# Patient Record
Sex: Female | Born: 1990 | Race: White | Hispanic: No | Marital: Single | Smoking: Former smoker
Health system: Southern US, Community
[De-identification: ages and names within clinical notes are randomized; demographics above are authoritative.]

## PROBLEM LIST (undated history)

## (undated) DIAGNOSIS — Z789 Other specified health status: Secondary | ICD-10-CM

## (undated) HISTORY — PX: WISDOM TOOTH EXTRACTION: SHX21

---
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
## 2013-08-23 ENCOUNTER — Other Ambulatory Visit: Payer: Self-pay | Admitting: Nurse Practitioner

## 2013-08-28 NOTE — Telephone Encounter (Signed)
Patient last seen in office on 09-02-12. Please advise

## 2013-09-23 ENCOUNTER — Other Ambulatory Visit: Payer: Self-pay | Admitting: Nurse Practitioner

## 2013-09-25 ENCOUNTER — Encounter: Payer: Self-pay | Admitting: Nurse Practitioner

## 2013-09-25 ENCOUNTER — Ambulatory Visit (INDEPENDENT_AMBULATORY_CARE_PROVIDER_SITE_OTHER): Payer: BC Managed Care – PPO | Admitting: Nurse Practitioner

## 2013-09-25 ENCOUNTER — Encounter (INDEPENDENT_AMBULATORY_CARE_PROVIDER_SITE_OTHER): Payer: Self-pay

## 2013-09-25 VITALS — BP 95/60 | HR 58 | Temp 98.2°F | Ht 64.0 in | Wt 126.0 lb

## 2013-09-25 DIAGNOSIS — R5383 Other fatigue: Secondary | ICD-10-CM

## 2013-09-25 DIAGNOSIS — Z0000 Encounter for general adult medical examination without abnormal findings: Secondary | ICD-10-CM

## 2013-09-25 DIAGNOSIS — Z01419 Encounter for gynecological examination (general) (routine) without abnormal findings: Secondary | ICD-10-CM

## 2013-09-25 DIAGNOSIS — R5381 Other malaise: Secondary | ICD-10-CM

## 2013-09-25 LAB — POCT URINALYSIS DIPSTICK
BILIRUBIN UA: NEGATIVE
GLUCOSE UA: NEGATIVE
KETONES UA: NEGATIVE
LEUKOCYTES UA: NEGATIVE
Nitrite, UA: NEGATIVE
PH UA: 5
Protein, UA: NEGATIVE
RBC UA: NEGATIVE
Spec Grav, UA: 1.015
Urobilinogen, UA: NEGATIVE

## 2013-09-25 MED ORDER — DROSPIRENONE-ETHINYL ESTRADIOL 3-0.02 MG PO TABS: ORAL_TABLET | ORAL | Status: DC

## 2013-09-25 NOTE — Progress Notes (Signed)
   Subjective:    Patient ID: Kayla Stone, female    DOB: 1991-03-23, 23 y.o.   MRN: [id number]  HPI  Patient here today for CPE and PAP- doing well - only complaint is fatigue- says she has no injury to do anything- started about 3 months ago- LMP 09/17/13    Review of Systems  Constitutional: Positive for fatigue. Negative for appetite change.  HENT: Negative.   Eyes: Negative.   Respiratory: Negative.   Cardiovascular: Negative.   Gastrointestinal: Negative.   Genitourinary: Negative.   Neurological: Negative.   Psychiatric/Behavioral: Negative.   All other systems reviewed and are negative.       Objective:   Physical Exam  Constitutional: She is oriented to person, place, and time. She appears well-developed and well-nourished.  HENT:  Head: Normocephalic.  Right Ear: Hearing, tympanic membrane, external ear and ear canal normal.  Left Ear: Hearing, tympanic membrane, external ear and ear canal normal.  Nose: Nose normal.  Mouth/Throat: Uvula is midline and oropharynx is clear and moist.  Eyes: Conjunctivae and EOM are normal. Pupils are equal, round, and reactive to light.  Neck: Normal range of motion and full passive range of motion without pain. Neck supple. No JVD present. Carotid bruit is not present. No mass and no thyromegaly present.  Cardiovascular: Normal rate, normal heart sounds and intact distal pulses.   No murmur heard. Pulmonary/Chest: Effort normal and breath sounds normal.  Abdominal: Soft. Bowel sounds are normal. She exhibits no mass. There is no tenderness.  Genitourinary: Vagina normal and uterus normal. No breast swelling, tenderness, discharge or bleeding.  bimanual exam-No adnexal masses or tenderness. Cervix nonparous and pink- no discharge  Musculoskeletal: Normal range of motion.  Lymphadenopathy:    She has no cervical adenopathy.  Neurological: She is alert and oriented to person, place, and time.  Skin: Skin is warm and dry.    Psychiatric: She has a normal mood and affect. Her behavior is normal. Judgment and thought content normal.    BP 95/60  Pulse 58  Temp(Src) 98.2 F (36.8 C) (Oral)  Ht $R'5\' 4"'rs$  (1.626 m)  Wt 126 lb (57.153 kg)  BMI 21.62 kg/m2       Assessment & Plan:   1. Annual physical exam   2. Other malaise and fatigue    Orders Placed This Encounter  Procedures  . CMP14+EGFR  . Thyroid Panel With TSH  . Anemia Profile B  . POCT urinalysis dipstick   Meds ordered this encounter  Medications  . drospirenone-ethinyl estradiol (GIANVI) 3-0.02 MG tablet    Sig: TAKE AS DIRECTED    Dispense:  28 tablet    Refill:  11    Order Specific Question:  Supervising Provider    Answer:  Chipper Herb [[id number]]     Labs pending Health maintenance reviewed Diet and exercise encouraged Continue all meds Follow up  In 1 year    Mary-Margaret Hassell Done, FNP

## 2013-09-25 NOTE — Patient Instructions (Signed)

## 2013-09-26 LAB — ANEMIA PROFILE B
BASOS: 1 %
Basophils Absolute: 0 10*3/uL (ref 0.0–0.2)
EOS ABS: 0.3 10*3/uL (ref 0.0–0.4)
EOS: 4 %
Ferritin: 25 ng/mL (ref 15–150)
Folate: 13.3 ng/mL (ref >3.0)
HCT: 42.3 % (ref 34.0–46.6)
HEMOGLOBIN: 14.4 g/dL (ref 11.1–15.9)
IMMATURE GRANS (ABS): 0 10*3/uL (ref 0.0–0.1)
IRON: 99 ug/dL (ref 35–155)
Immature Granulocytes: 0 %
Iron Saturation: 32 % (ref 15–55)
Lymphocytes Absolute: 2.3 10*3/uL (ref 0.7–3.1)
Lymphs: 35 %
MCH: 32.1 pg (ref 26.6–33.0)
MCHC: 34 g/dL (ref 31.5–35.7)
MCV: 94 fL (ref 79–97)
MONOS ABS: 0.5 10*3/uL (ref 0.1–0.9)
Monocytes: 8 %
NEUTROS ABS: 3.4 10*3/uL (ref 1.4–7.0)
Neutrophils Relative %: 52 %
Platelets: 270 10*3/uL (ref 150–379)
RBC: 4.48 x10E6/uL (ref 3.77–5.28)
RDW: 12.8 % (ref 12.3–15.4)
Retic Ct Pct: 1 % (ref 0.6–2.6)
TIBC: 308 ug/dL (ref 250–450)
UIBC: 209 ug/dL (ref 150–375)
VITAMIN B 12: 314 pg/mL (ref 211–946)
WBC: 6.6 10*3/uL (ref 3.4–10.8)

## 2013-09-26 LAB — CMP14+EGFR
A/G RATIO: 2 (ref 1.1–2.5)
ALT: 11 IU/L (ref 0–32)
AST: 20 IU/L (ref 0–40)
Albumin: 4.4 g/dL (ref 3.5–5.5)
Alkaline Phosphatase: 58 IU/L (ref 39–117)
BUN/Creatinine Ratio: 6 — ABNORMAL LOW (ref 8–20)
BUN: 4 mg/dL — AB (ref 6–20)
CO2: 26 mmol/L (ref 18–29)
CREATININE: 0.69 mg/dL (ref 0.57–1.00)
Calcium: 9.8 mg/dL (ref 8.7–10.2)
Chloride: 101 mmol/L (ref 97–108)
GFR calc Af Amer: 143 mL/min/{1.73_m2} (ref >59)
GFR, EST NON AFRICAN AMERICAN: 124 mL/min/{1.73_m2} (ref >59)
GLOBULIN, TOTAL: 2.2 g/dL (ref 1.5–4.5)
Glucose: 80 mg/dL (ref 65–99)
Potassium: 4.5 mmol/L (ref 3.5–5.2)
SODIUM: 142 mmol/L (ref 134–144)
Total Bilirubin: 0.4 mg/dL (ref 0.0–1.2)
Total Protein: 6.6 g/dL (ref 6.0–8.5)

## 2013-09-26 LAB — THYROID PANEL WITH TSH
FREE THYROXINE INDEX: 2.6 (ref 1.2–4.9)
T3 UPTAKE RATIO: 25 % (ref 24–39)
T4 TOTAL: 10.2 ug/dL (ref 4.5–12.0)
TSH: 3.45 u[IU]/mL (ref 0.450–4.500)

## 2013-09-27 LAB — PAP IG, CT-NG, RFX HPV ASCU
Chlamydia, Nuc. Acid Amp: NEGATIVE
GONOCOCCUS BY NUCLEIC ACID AMP: NEGATIVE
PAP Smear Comment: 0

## 2013-09-29 ENCOUNTER — Ambulatory Visit (INDEPENDENT_AMBULATORY_CARE_PROVIDER_SITE_OTHER): Payer: BC Managed Care – PPO | Admitting: Nurse Practitioner

## 2013-09-29 ENCOUNTER — Encounter: Payer: Self-pay | Admitting: Nurse Practitioner

## 2013-09-29 VITALS — BP 104/64 | HR 94 | Temp 98.4°F | Ht 64.0 in | Wt 127.0 lb

## 2013-09-29 DIAGNOSIS — J0100 Acute maxillary sinusitis, unspecified: Secondary | ICD-10-CM

## 2013-09-29 MED ORDER — AZITHROMYCIN 250 MG PO TABS: ORAL_TABLET | ORAL | Status: DC

## 2013-09-29 NOTE — Progress Notes (Signed)
   Subjective:    Patient ID: Kayla Stone, female    DOB: 03/16/1991, 23 y.o.   MRN: [id number]  HPI  Patient here today with c/o cough and congestion and headache- started 2 days ago.    Review of Systems  Constitutional: Positive for fever (low grade fever). Negative for chills.  HENT: Positive for congestion, rhinorrhea and sinus pressure. Negative for sore throat and trouble swallowing.   Respiratory: Positive for cough (productive).   Cardiovascular: Negative.   Gastrointestinal: Negative.   All other systems reviewed and are negative.       Objective:   Physical Exam  Constitutional: She is oriented to person, place, and time. She appears well-developed and well-nourished.  HENT:  Right Ear: Hearing, tympanic membrane, external ear and ear canal normal.  Left Ear: Hearing, tympanic membrane, external ear and ear canal normal.  Nose: Mucosal edema and rhinorrhea present. Right sinus exhibits maxillary sinus tenderness. Right sinus exhibits no frontal sinus tenderness. Left sinus exhibits maxillary sinus tenderness. Left sinus exhibits no frontal sinus tenderness.  Mouth/Throat: Uvula is midline, oropharynx is clear and moist and mucous membranes are normal.  Eyes: Pupils are equal, round, and reactive to light.  Neck: Normal range of motion. Neck supple.  Cardiovascular: Normal rate, regular rhythm and normal heart sounds.   Pulmonary/Chest: Effort normal and breath sounds normal.  Abdominal: Soft. Bowel sounds are normal.  Lymphadenopathy:    She has no cervical adenopathy.  Neurological: She is alert and oriented to person, place, and time.  Skin: Skin is warm.  Psychiatric: She has a normal mood and affect. Her behavior is normal. Judgment and thought content normal.    BP 104/64  Pulse 94  Temp(Src) 98.4 F (36.9 C) (Oral)  Ht 5\' 4"  (1.626 m)  Wt 127 lb (57.607 kg)  BMI 21.79 kg/m2       Assessment & Plan:   1. Sinusitis, acute, maxillary    Meds  ordered this encounter  Medications  . azithromycin (ZITHROMAX Z-PAK) 250 MG tablet    Sig: As directed    Dispense:  6 each    Refill:  0    Order Specific Question:  Supervising Provider    Answer:  Ernestina PennaMOORE, DONALD W [[id number]]   1. Take meds as prescribed 2. Use a cool mist humidifier especially during the winter months and when heat has been humid. 3. Use saline nose sprays frequently 4. Saline irrigations of the nose can be very helpful if done frequently.  * 4X daily for 1 week*  * Use of a nettie pot can be helpful with this. Follow directions with this* 5. Drink plenty of fluids 6. Keep thermostat turn down low 7.For any cough or congestion  Use plain Mucinex- regular strength or max strength is fine   * Children- consult with Pharmacist for dosing 8. For fever or aces or pains- take tylenol or ibuprofen appropriate for age and weight.  * for fevers greater than 101 orally you may alternate ibuprofen and tylenol every  3 hours.   Mary-Margaret Daphine DeutscherMartin, FNP

## 2013-09-29 NOTE — Patient Instructions (Signed)

## 2013-10-12 ENCOUNTER — Ambulatory Visit: Payer: BC Managed Care – PPO | Admitting: Nurse Practitioner

## 2013-10-13 ENCOUNTER — Encounter: Payer: Self-pay | Admitting: General Practice

## 2013-10-13 ENCOUNTER — Ambulatory Visit: Payer: BC Managed Care – PPO | Admitting: General Practice

## 2013-10-13 ENCOUNTER — Ambulatory Visit (INDEPENDENT_AMBULATORY_CARE_PROVIDER_SITE_OTHER): Payer: BC Managed Care – PPO | Admitting: General Practice

## 2013-10-13 VITALS — BP 109/69 | HR 68 | Temp 97.7°F | Ht 64.0 in | Wt 123.0 lb

## 2013-10-13 DIAGNOSIS — L0231 Cutaneous abscess of buttock: Secondary | ICD-10-CM

## 2013-10-13 DIAGNOSIS — L03317 Cellulitis of buttock: Secondary | ICD-10-CM

## 2013-10-13 MED ORDER — CEPHALEXIN 500 MG PO CAPS: 500.0000 mg | ORAL_CAPSULE | Freq: Two times a day (BID) | ORAL | Status: DC

## 2013-10-13 NOTE — Patient Instructions (Signed)
Abscess An abscess is an infected area that contains a collection of pus and debris.It can occur in almost any part of the body. An abscess is also known as a furuncle or boil. CAUSES  An abscess occurs when tissue gets infected. This can occur from blockage of oil or sweat glands, infection of hair follicles, or a minor injury to the skin. As the body tries to fight the infection, pus collects in the area and creates pressure under the skin. This pressure causes pain. People with weakened immune systems have difficulty fighting infections and get certain abscesses more often.  SYMPTOMS Usually an abscess develops on the skin and becomes a painful mass that is red, warm, and tender. If the abscess forms under the skin, you may feel a moveable soft area under the skin. Some abscesses break open (rupture) on their own, but most will continue to get worse without care. The infection can spread deeper into the body and eventually into the bloodstream, causing you to feel ill.  DIAGNOSIS  Your caregiver will take your medical history and perform a physical exam. A sample of fluid may also be taken from the abscess to determine what is causing your infection. TREATMENT  Your caregiver may prescribe antibiotic medicines to fight the infection. However, taking antibiotics alone usually does not cure an abscess. Your caregiver may need to make a small cut (incision) in the abscess to drain the pus. In some cases, gauze is packed into the abscess to reduce pain and to continue draining the area. HOME CARE INSTRUCTIONS   Only take over-the-counter or prescription medicines for pain, discomfort, or fever as directed by your caregiver.  If you were prescribed antibiotics, take them as directed. Finish them even if you start to feel better.  If gauze is used, follow your caregiver's directions for changing the gauze.  To avoid spreading the infection:  Keep your draining abscess covered with a  bandage.  Wash your hands well.  Do not share personal care items, towels, or whirlpools with others.  Avoid skin contact with others.  Keep your skin and clothes clean around the abscess.  Keep all follow-up appointments as directed by your caregiver. SEEK MEDICAL CARE IF:   You have increased pain, swelling, redness, fluid drainage, or bleeding.  You have muscle aches, chills, or a general ill feeling.  You have a fever. MAKE SURE YOU:   Understand these instructions.  Will watch your condition.  Will get help right away if you are not doing well or get worse. Document Released: 05/06/2005 Document Revised: 01/26/2012 Document Reviewed: 10/09/2011 ExitCare Patient Information 2014 [employer].  

## 2013-10-13 NOTE — Progress Notes (Signed)
   Subjective:    Patient ID: Kayla Stone, female    DOB: 06/09/1991, 23 y.o.   MRN: [id number]  HPI Patient presents today with complaints of tender area to right buttocks. Onset was Tuesday and gradually worsened in discomfort. Denies drainage.     Review of Systems  Constitutional: Negative for fever and chills.  Respiratory: Negative for chest tightness and shortness of breath.   Cardiovascular: Negative for chest pain and palpitations.  Skin:       Red tender area to right buttocks       Objective:   Physical Exam  Constitutional: She is oriented to person, place, and time. She appears well-developed and well-nourished.  Cardiovascular: Normal rate, regular rhythm and normal heart sounds.   Pulmonary/Chest: Effort normal and breath sounds normal. No respiratory distress. She exhibits no tenderness.  Neurological: She is alert and oriented to person, place, and time.  Skin: Skin is warm and dry.  Erythematous, firm area 1/4 x 1/4 inch. Negative drainage.           Assessment & Plan:  1. Abscess of buttock, right - cephALEXin (KEFLEX) 500 MG capsule; Take 1 capsule (500 mg total) by mouth 2 (two) times daily.  Dispense: 20 capsule; Refill: 0 -increase fluids -keep area clean and dry -RTO if symptoms worsen or unresolved Patient verbalized understanding Coralie KeensMae E. Kumar Falwell, FNP-C

## 2013-11-14 ENCOUNTER — Ambulatory Visit (INDEPENDENT_AMBULATORY_CARE_PROVIDER_SITE_OTHER): Payer: BC Managed Care – PPO | Admitting: Family Medicine

## 2013-11-14 ENCOUNTER — Encounter (INDEPENDENT_AMBULATORY_CARE_PROVIDER_SITE_OTHER): Payer: Self-pay

## 2013-11-14 ENCOUNTER — Encounter: Payer: Self-pay | Admitting: Family Medicine

## 2013-11-14 VITALS — BP 114/72 | HR 65 | Temp 98.5°F | Ht 64.0 in | Wt 117.6 lb

## 2013-11-14 DIAGNOSIS — J209 Acute bronchitis, unspecified: Secondary | ICD-10-CM

## 2013-11-14 MED ORDER — METHYLPREDNISOLONE ACETATE 80 MG/ML IJ SUSP
80.0000 mg | Freq: Once | INTRAMUSCULAR | Status: AC
  Administered 2013-11-14: 80 mg via INTRAMUSCULAR

## 2013-11-14 MED ORDER — AMOXICILLIN 875 MG PO TABS: 875.0000 mg | ORAL_TABLET | Freq: Two times a day (BID) | ORAL | Status: DC

## 2013-11-14 NOTE — Progress Notes (Signed)
   Subjective:    Patient ID: Kayla Stone, female    DOB: 11/08/1990, 23 y.o.   MRN: [id number]  HPI This 23 y.o. female presents for evaluation of URI sx's and cough.   Review of Systems    No chest pain, SOB, HA, dizziness, vision change, N/V, diarrhea, constipation, dysuria, urinary urgency or frequency, myalgias, arthralgias or rash.  Objective:   Physical Exam  Vital signs noted  Well developed well nourished female.  HEENT - Head atraumatic Normocephalic                Eyes - PERRLA, Conjuctiva - clear Sclera- Clear EOMI                Ears - EAC's Wnl TM's Wnl Gross Hearing                Throat - oropharanx wnl Respiratory - Lungs CTA bilateral Cardiac - RRR S1 and S2 without murmur GI - Abdomen soft Nontender and bowel sounds active x 4 Extremities - No edema. Neuro - Grossly intact.      Assessment & Plan:  Acute bronchitis - Plan: amoxicillin (AMOXIL) 875 MG tablet, methylPREDNISolone acetate (DEPO-MEDROL) injection 80 mg  Push po fluids, rest, tylenol and motrin otc prn as directed for fever, arthralgias, and myalgias.  Follow up prn if sx's continue or persist.  Deatra CanterWilliam J Albertus Chiarelli FNP

## 2014-07-11 ENCOUNTER — Ambulatory Visit (INDEPENDENT_AMBULATORY_CARE_PROVIDER_SITE_OTHER): Payer: BC Managed Care – PPO | Admitting: Family Medicine

## 2014-07-11 ENCOUNTER — Encounter: Payer: Self-pay | Admitting: Family Medicine

## 2014-07-11 VITALS — BP 132/78 | HR 69 | Temp 98.1°F | Ht 64.0 in | Wt 121.2 lb

## 2014-07-11 DIAGNOSIS — R21 Rash and other nonspecific skin eruption: Secondary | ICD-10-CM

## 2014-07-11 DIAGNOSIS — J206 Acute bronchitis due to rhinovirus: Secondary | ICD-10-CM

## 2014-07-11 MED ORDER — NYSTATIN 100000 UNIT/GM EX CREA: 1.0000 "application " | TOPICAL_CREAM | Freq: Two times a day (BID) | CUTANEOUS | Status: DC

## 2014-07-11 MED ORDER — FLUCONAZOLE 150 MG PO TABS: 150.0000 mg | ORAL_TABLET | Freq: Once | ORAL | Status: DC

## 2014-07-11 MED ORDER — BENZONATATE 100 MG PO CAPS: 100.0000 mg | ORAL_CAPSULE | Freq: Three times a day (TID) | ORAL | Status: DC | PRN

## 2014-07-11 MED ORDER — AMOXICILLIN 875 MG PO TABS: 875.0000 mg | ORAL_TABLET | Freq: Two times a day (BID) | ORAL | Status: DC

## 2014-07-11 NOTE — Progress Notes (Signed)
   Subjective:    Patient ID: Kayla Stone, female    DOB: 11/21/1990, 23 y.o.   MRN: [id number]  HPI Patient is here for c/o uri sx's and a cyst on her labia that has popped and is still healing.  Review of Systems  Constitutional: Negative for fever.  HENT: Negative for ear pain.   Eyes: Negative for discharge.  Respiratory: Negative for cough.   Cardiovascular: Negative for chest pain.  Gastrointestinal: Negative for abdominal distention.  Endocrine: Negative for polyuria.  Genitourinary: Negative for difficulty urinating.  Musculoskeletal: Negative for gait problem and neck pain.  Skin: Negative for color change and rash.  Neurological: Negative for speech difficulty and headaches.  Psychiatric/Behavioral: Negative for agitation.       Objective:    BP 132/78 mmHg  Pulse 69  Temp(Src) 98.1 F (36.7 C) (Oral)  Ht 5\' 4"  (1.626 m)  Wt 121 lb 3.2 oz (54.976 kg)  BMI 20.79 kg/m2 Physical Exam        Assessment & Plan:     ICD-9-CM ICD-10-CM   1. Acute bronchitis due to Rhinovirus 466.0 J20.6 amoxicillin (AMOXIL) 875 MG tablet   079.3  benzonatate (TESSALON PERLES) 100 MG capsule  2. Rash and nonspecific skin eruption 782.1 R21 nystatin cream (MYCOSTATIN)     fluconazole (DIFLUCAN) 150 MG tablet     No Follow-up on file.  Deatra CanterWilliam J Davyon Fisch FNP

## 2014-07-31 ENCOUNTER — Ambulatory Visit (INDEPENDENT_AMBULATORY_CARE_PROVIDER_SITE_OTHER): Payer: BC Managed Care – PPO | Admitting: Nurse Practitioner

## 2014-07-31 ENCOUNTER — Encounter: Payer: Self-pay | Admitting: Nurse Practitioner

## 2014-07-31 ENCOUNTER — Ambulatory Visit: Payer: BC Managed Care – PPO | Admitting: Nurse Practitioner

## 2014-07-31 VITALS — BP 110/62 | HR 78 | Temp 97.0°F | Ht 64.0 in | Wt 124.0 lb

## 2014-07-31 DIAGNOSIS — B880 Other acariasis: Secondary | ICD-10-CM

## 2014-07-31 NOTE — Patient Instructions (Signed)
[facility]- Therapist [address] [place] ,[place] [address] [phone] Children limited to anxiety and depression- NO ADD/ADHD Does not accept Medicaid  [facility] [address] [place], [place] [phone] Does see children Does accept medicaid Will assess for Autism but not treat  [facility] [address] [place] [phone] Does see children  Does accept Medicaid Medication management- substance abuse- bipolar- grief- family-marriage- OCD- Anxiety- PTSD  [facility] [address] [place]  [phone] Does see children Does accept medicaid They do perform psychological testing  [facility] [address] [place] Schedule through [employer] [phone] Patient must call and make own appointment Does se children Does accept Medicaid  [facility] [address] [place], [place] [phone] Sees Children 7-10 accompanied by an adult, 11 and up by themselves Does accept Medicaid Will see patients with- substance abuse-ADHD-ADD-Bipolar-Domestic violence-Marriage counseling- Family Counseling and sexual abuse  [facility]- Psychologist and Psychiatrist [address] [place] [phone] Does see children Does accept Medicaid  [facility] [address] [place] [phone]  Dr. Lugo-  Psychiatrist [address] [place], [place] [phone] Specializes in ADHD and addictions They do ADHD testing Suboxone clinic  [facility] [address] [place] [phone] Does Child psychological testing  [facility] [address] [place] [phone] Does Accept Medicaid Evaluates for Autism  [facility] [address] [place] [phone] Does Not accept Medicaid Does do adult ADD  evaluations  Dr. Akinlayo [address] [place] [phone] Does not Take Medicaid Sees ADD and ADHD for treatment      [facility] [address] [place],  [address] [phone] Takes Medicaid WIll see children as young as 3         

## 2014-07-31 NOTE — Progress Notes (Signed)
   Subjective:    Patient ID: Lyndel Saferosby K Rihn, female    DOB: 08/30/1990, 23 y.o.   MRN: [id number]  HPI Patient went to urgent Care last Thursday with what she thought was hives- they diagnosed her with scabies- patient says that she is no better    Review of Systems  Constitutional: Negative.   HENT: Negative.   Respiratory: Negative.   Cardiovascular: Negative.   Gastrointestinal: Negative.   Genitourinary: Negative.   Neurological: Negative.   Psychiatric/Behavioral: Negative.   All other systems reviewed and are negative.      Objective:   Physical Exam  Constitutional: She appears well-developed and well-nourished.  Cardiovascular: Normal rate, regular rhythm and normal heart sounds.   Pulmonary/Chest: Effort normal and breath sounds normal.  Skin: Skin is warm and dry.  Psychiatric: She has a normal mood and affect. Her behavior is normal. Judgment and thought content normal.   BP 110/62 mmHg  Pulse 78  Temp(Src) 97 F (36.1 C) (Oral)  Ht 5\' 4"  (1.626 m)  Wt 124 lb (56.246 kg)  BMI 21.27 kg/m2       Assessment & Plan:   1. Chiggers    Avoid scratching Has to run its course RTO prn  Mary-Margaret Daphine DeutscherMartin, FNP

## 2014-08-14 ENCOUNTER — Telehealth: Payer: Self-pay | Admitting: Nurse Practitioner

## 2014-08-14 NOTE — Telephone Encounter (Signed)
Pt scheduled with MMM 08/16/14 @ 12:15.

## 2014-08-14 NOTE — Telephone Encounter (Signed)
NTBS.

## 2014-08-16 ENCOUNTER — Ambulatory Visit (INDEPENDENT_AMBULATORY_CARE_PROVIDER_SITE_OTHER): Payer: BLUE CROSS/BLUE SHIELD | Admitting: Nurse Practitioner

## 2014-08-16 ENCOUNTER — Encounter: Payer: Self-pay | Admitting: Nurse Practitioner

## 2014-08-16 VITALS — Ht 64.0 in | Wt 122.0 lb

## 2014-08-16 DIAGNOSIS — R233 Spontaneous ecchymoses: Secondary | ICD-10-CM

## 2014-08-16 DIAGNOSIS — L299 Pruritus, unspecified: Secondary | ICD-10-CM

## 2014-08-16 DIAGNOSIS — R238 Other skin changes: Secondary | ICD-10-CM

## 2014-08-16 MED ORDER — HYDROXYZINE HCL 10 MG PO TABS: 10.0000 mg | ORAL_TABLET | Freq: Three times a day (TID) | ORAL | Status: DC | PRN

## 2014-08-16 NOTE — Patient Instructions (Signed)
Pruritus  °Pruritus is an itch. There are many different problems that can cause an itch. Dry skin is one of the most common causes of itching. Most cases of itching do not require medical attention.  °HOME CARE INSTRUCTIONS  °Make sure your skin is moistened on a regular basis. A moisturizer that contains petroleum jelly is best for keeping moisture in your skin. If you develop a rash, you may try the following for relief:  °· Use corticosteroid cream. °· Apply cool compresses to the affected areas. °· Bathe with Epsom salts or baking soda in the bathwater. °· Soak in colloidal oatmeal baths. These are available at your pharmacy. °· Apply baking soda paste to the rash. Stir water into baking soda until it reaches a paste-like consistency. °· Use an anti-itch lotion. °· Take over-the-counter diphenhydramine medicine by mouth as the instructions direct. °· Avoid scratching. Scratching may cause the rash to become infected. If itching is very bad, your caregiver may suggest prescription lotions or creams to lessen your symptoms. °· Avoid hot showers, which can make itching worse. A cold shower may help with itching as long as you use a moisturizer after the shower. °SEEK MEDICAL CARE IF: °The itching does not go away after several days. °Document Released: 04/08/2011 Document Revised: 12/11/2013 Document Reviewed: 04/08/2011 °ExitCare® Patient Information ©2015 [employer]. This information is not intended to replace advice given to you by your health care provider. Make sure you discuss any questions you have with your health care provider. ° °

## 2014-08-16 NOTE — Progress Notes (Signed)
   Subjective:    Patient ID: Kayla Stone, female    DOB: 09/16/1990, 24 y.o.   MRN: [id number]  HPI Patient in today of still itching all over- she is scratching so much that she is bruising all over. Was seen in urgent care several weeks ago and was dx with scabies- was treated and got no better. Came in to see me on 07/31/14 and was told it looked more like chiggers. She is still breaking out and itching bad.     Review of Systems  Constitutional: Negative.   HENT: Negative.   Respiratory: Negative.   Cardiovascular: Negative.   Genitourinary: Negative.   Neurological: Negative.   Psychiatric/Behavioral: Negative.   All other systems reviewed and are negative.      Objective:   Physical Exam  Constitutional: She is oriented to person, place, and time. She appears well-developed and well-nourished.  Cardiovascular: Normal rate, regular rhythm and normal heart sounds.   Pulmonary/Chest: Effort normal and breath sounds normal.  Neurological: She is alert and oriented to person, place, and time.  Skin: Skin is warm. Rash noted.  Erythematous maculopapular lesions scattered allover body. Echymosis in areas where scratched.  Psychiatric: She has a normal mood and affect. Her behavior is normal. Judgment and thought content normal.   Ht 5\' 4"  (1.626 m)  Wt 122 lb (55.339 kg)  BMI 20.93 kg/m2        Assessment & Plan:   1. Easy bruising   2. Pruritic dermatitis    Orders Placed This Encounter  Procedures  . Anemia Profile B   Meds ordered this encounter  Medications  . hydrOXYzine (ATARAX/VISTARIL) 10 MG tablet    Sig: Take 1 tablet (10 mg total) by mouth 3 (three) times daily as needed.    Dispense:  30 tablet    Refill:  0    Order Specific Question:  Supervising Provider    Answer:  Kayla Stone [[id number]]   Avoid scratching Labs pending to derm if doesn't improve  Kayla Daphine DeutscherMartin, FNP

## 2014-08-17 LAB — ANEMIA PROFILE B
BASOS ABS: 0.1 10*3/uL (ref 0.0–0.2)
BASOS: 1 %
EOS: 2 %
Eosinophils Absolute: 0.2 10*3/uL (ref 0.0–0.4)
FERRITIN: 39 ng/mL (ref 15–150)
Folate: 7.8 ng/mL (ref >3.0)
HCT: 42.7 % (ref 34.0–46.6)
Hemoglobin: 14.5 g/dL (ref 11.1–15.9)
IMMATURE GRANULOCYTES: 0 %
IRON SATURATION: 17 % (ref 15–55)
Immature Grans (Abs): 0 10*3/uL (ref 0.0–0.1)
Iron: 65 ug/dL (ref 35–155)
Lymphocytes Absolute: 2.9 10*3/uL (ref 0.7–3.1)
Lymphs: 37 %
MCH: 32.7 pg (ref 26.6–33.0)
MCHC: 34 g/dL (ref 31.5–35.7)
MCV: 96 fL (ref 79–97)
MONOCYTES: 7 %
Monocytes Absolute: 0.5 10*3/uL (ref 0.1–0.9)
Neutrophils Absolute: 4.3 10*3/uL (ref 1.4–7.0)
Neutrophils Relative %: 53 %
Platelets: 265 10*3/uL (ref 150–379)
RBC: 4.44 x10E6/uL (ref 3.77–5.28)
RDW: 13.2 % (ref 12.3–15.4)
RETIC CT PCT: 1.3 % (ref 0.6–2.6)
TIBC: 381 ug/dL (ref 250–450)
UIBC: 316 ug/dL (ref 150–375)
Vitamin B-12: 300 pg/mL (ref 211–946)
WBC: 8 10*3/uL (ref 3.4–10.8)

## 2014-10-19 ENCOUNTER — Telehealth: Payer: Self-pay | Admitting: Nurse Practitioner

## 2014-10-19 MED ORDER — DROSPIRENONE-ETHINYL ESTRADIOL 3-0.02 MG PO TABS: ORAL_TABLET | ORAL | Status: DC

## 2014-10-19 NOTE — Telephone Encounter (Signed)
Pt aware rx sent to pharmacy.

## 2014-10-19 NOTE — Telephone Encounter (Signed)
Birth control rx sent to pharmacy 

## 2014-11-19 ENCOUNTER — Ambulatory Visit (INDEPENDENT_AMBULATORY_CARE_PROVIDER_SITE_OTHER): Payer: BLUE CROSS/BLUE SHIELD | Admitting: Family

## 2014-11-19 ENCOUNTER — Encounter: Payer: Self-pay | Admitting: Family

## 2014-11-19 VITALS — BP 118/76 | HR 67 | Temp 98.6°F | Ht 64.0 in | Wt 130.4 lb

## 2014-11-19 DIAGNOSIS — L509 Urticaria, unspecified: Secondary | ICD-10-CM | POA: Diagnosis not present

## 2014-11-19 MED ORDER — DROSPIRENONE-ETHINYL ESTRADIOL 3-0.02 MG PO TABS: ORAL_TABLET | ORAL | Status: DC

## 2014-11-19 NOTE — Progress Notes (Signed)
   Subjective:    Patient ID: Kayla Stone, female    DOB: 05/24/1991, 24 y.o.   MRN: [id number]  Urticaria This is a chronic problem. The current episode started more than 1 month ago. The problem has been waxing and waning since onset. The affected locations include the right lower leg, left arm, right arm and left upper leg. The rash is characterized by itchiness and redness. She was exposed to nothing. Pertinent negatives include no congestion, cough, diarrhea, joint pain, shortness of breath or sore throat. Past treatments include antibiotic cream, anti-itch cream, antihistamine, oral steroids and topical steroids. The treatment provided mild relief.   *Pt states this has been going on for the last 6 months and has been to her PCP twice, urgent care, two dermatologists , and psychologists. Pt states she has hives on a daily basis and have used steroid creams, prednisone, steroid shots, celexa, and atarax with no relief.     Review of Systems  Constitutional: Negative.   HENT: Negative.  Negative for congestion and sore throat.   Eyes: Negative.   Respiratory: Negative.  Negative for cough and shortness of breath.   Cardiovascular: Negative.  Negative for palpitations.  Gastrointestinal: Negative.  Negative for diarrhea.  Endocrine: Negative.   Genitourinary: Negative.   Musculoskeletal: Negative.  Negative for joint pain.  Neurological: Negative.  Negative for headaches.  Hematological: Negative.   Psychiatric/Behavioral: Negative.   All other systems reviewed and are negative.      Objective:   Physical Exam  Constitutional: She is oriented to person, place, and time. She appears well-developed and well-nourished. No distress.  HENT:  Head: Normocephalic and atraumatic.  Right Ear: External ear normal.  Left Ear: External ear normal.  Nose: Nose normal.  Mouth/Throat: Oropharynx is clear and moist.  Eyes: Pupils are equal, round, and reactive to light.  Neck: Normal range  of motion. Neck supple. No thyromegaly present.  Cardiovascular: Normal rate, regular rhythm, normal heart sounds and intact distal pulses.   No murmur heard. Pulmonary/Chest: Effort normal and breath sounds normal. No respiratory distress. She has no wheezes.  Abdominal: Soft. Bowel sounds are normal. She exhibits no distension. There is no tenderness.  Musculoskeletal: Normal range of motion. She exhibits no edema or tenderness.  Neurological: She is alert and oriented to person, place, and time. She has normal reflexes. No cranial nerve deficit.  Skin: Skin is warm and dry. Rash noted. Rash is urticarial (generalized urticarial on upper thighs and left abd side).  Psychiatric: She has a normal mood and affect. Her behavior is normal. Judgment and thought content normal.  Vitals reviewed.  BP 118/76 mmHg  Pulse 67  Temp(Src) 98.6 F (37 C) (Oral)  Ht 5\' 4"  (1.626 m)  Wt 130 lb 6.4 oz (59.149 kg)  BMI 22.37 kg/m2        Assessment & Plan:  1. Hives -Do not scratch -Continue with food diary -RTO prn  - Ambulatory referral to Allergy  Jannifer Rodneyhristy Ruxin Ransome, FNP

## 2014-11-19 NOTE — Patient Instructions (Signed)
Hives Hives are itchy, red, swollen areas of the skin. They can vary in size and location on your body. Hives can come and go for hours or several days (acute hives) or for several weeks (chronic hives). Hives do not spread from person to person (noncontagious). They may get worse with scratching, exercise, and emotional stress. CAUSES   Allergic reaction to food, additives, or drugs.  Infections, including the common cold.  Illness, such as vasculitis, lupus, or thyroid disease.  Exposure to sunlight, heat, or cold.  Exercise.  Stress.  Contact with chemicals. SYMPTOMS   Red or white swollen patches on the skin. The patches may change size, shape, and location quickly and repeatedly.  Itching.  Swelling of the hands, feet, and face. This may occur if hives develop deeper in the skin. DIAGNOSIS  Your caregiver can usually tell what is wrong by performing a physical exam. Skin or blood tests may also be done to determine the cause of your hives. In some cases, the cause cannot be determined. TREATMENT  Mild cases usually get better with medicines such as antihistamines. Severe cases may require an emergency epinephrine injection. If the cause of your hives is known, treatment includes avoiding that trigger.  HOME CARE INSTRUCTIONS   Avoid causes that trigger your hives.  Take antihistamines as directed by your caregiver to reduce the severity of your hives. Non-sedating or low-sedating antihistamines are usually recommended. Do not drive while taking an antihistamine.  Take any other medicines prescribed for itching as directed by your caregiver.  Wear loose-fitting clothing.  Keep all follow-up appointments as directed by your caregiver. SEEK MEDICAL CARE IF:   You have persistent or severe itching that is not relieved with medicine.  You have painful or swollen joints. SEEK IMMEDIATE MEDICAL CARE IF:   You have a fever.  Your tongue or lips are swollen.  You have  trouble breathing or swallowing.  You feel tightness in the throat or chest.  You have abdominal pain. These problems may be the first sign of a life-threatening allergic reaction. Call your local emergency services (911 in [place] MAKE SURE YOU:   Understand these instructions.  Will watch your condition.  Will get help right away if you are not doing well or get worse. Document Released: 07/27/2005 Document Revised: 08/01/2013 Document Reviewed: 10/20/2011 ExitCare Patient Information 2015 [employer]. This information is not intended to replace advice given to you by your health care provider. Make sure you discuss any questions you have with your health care provider.  

## 2014-11-27 ENCOUNTER — Ambulatory Visit (INDEPENDENT_AMBULATORY_CARE_PROVIDER_SITE_OTHER): Payer: BLUE CROSS/BLUE SHIELD | Admitting: Nurse Practitioner

## 2014-11-27 ENCOUNTER — Encounter: Payer: Self-pay | Admitting: Nurse Practitioner

## 2014-11-27 VITALS — BP 111/65 | HR 66 | Temp 98.9°F | Ht 64.0 in | Wt 132.0 lb

## 2014-11-27 DIAGNOSIS — Z308 Encounter for other contraceptive management: Secondary | ICD-10-CM | POA: Diagnosis not present

## 2014-11-27 DIAGNOSIS — Z0000 Encounter for general adult medical examination without abnormal findings: Secondary | ICD-10-CM

## 2014-11-27 DIAGNOSIS — F411 Generalized anxiety disorder: Secondary | ICD-10-CM | POA: Insufficient documentation

## 2014-11-27 DIAGNOSIS — Z01419 Encounter for gynecological examination (general) (routine) without abnormal findings: Secondary | ICD-10-CM

## 2014-11-27 LAB — POCT UA - MICROSCOPIC ONLY
Casts, Ur, LPF, POC: NEGATIVE
Crystals, Ur, HPF, POC: NEGATIVE
Mucus, UA: NEGATIVE
RBC, URINE, MICROSCOPIC: NEGATIVE
Yeast, UA: NEGATIVE

## 2014-11-27 LAB — POCT URINALYSIS DIPSTICK
Bilirubin, UA: NEGATIVE
Glucose, UA: NEGATIVE
KETONES UA: NEGATIVE
NITRITE UA: NEGATIVE
PH UA: 7.5
Protein, UA: NEGATIVE
RBC UA: NEGATIVE
SPEC GRAV UA: 1.01
Urobilinogen, UA: NEGATIVE

## 2014-11-27 MED ORDER — DROSPIRENONE-ETHINYL ESTRADIOL 3-0.02 MG PO TABS: ORAL_TABLET | ORAL | Status: DC

## 2014-11-27 NOTE — Patient Instructions (Signed)
Pap Test A Pap test checks the cells on the surface of your cervix. Your doctor will look for cell changes that are not normal, an infection, or cancer. If the cells no longer look normal, it is called dysplasia. Dysplasia can turn into cancer. Regular Pap tests are important to stop cancer from developing. BEFORE THE PROCEDURE  Ask your doctor when to schedule your Pap test. Timing the test around your period may be important.  Do not douche or have sex (intercourse) for 24 hours before the test.  Do not put creams on your vagina or use tampons for 24 hours before the test.  Go pee (urinate) just before the test. PROCEDURE  You will lie on an exam table with your feet in stirrups.  A warm metal or plastic tool (speculum) will be put in your vagina to open it up.  Your doctor will use a small, plastic brush or wooden spatula to take cells from your cervix.  The cells will be put in a lab container.  The cells will be checked under a microscope to see if they are normal or not. AFTER THE PROCEDURE Get your test results. If they are abnormal, you may need more tests. Document Released: 08/29/2010 Document Revised: 10/19/2011 Document Reviewed: 07/23/2011 ExitCare Patient Information 2015 [employer]. This information is not intended to replace advice given to you by your health care provider. Make sure you discuss any questions you have with your health care provider.  

## 2014-11-27 NOTE — Progress Notes (Signed)
   Subjective:    Patient ID: Kayla Stone, female    DOB: [birth year], 24 y.o.   MRN: [id number]  HPI  Patient is here today for CPE with pap. No acute complaint. She is doing well.    Review of Systems  Constitutional: Negative.   HENT: Negative.   Respiratory: Negative.   Cardiovascular: Negative.   Gastrointestinal: Negative.   Genitourinary: Negative.   Neurological: Negative.   Psychiatric/Behavioral: Negative.   All other systems reviewed and are negative.      Objective:   Physical Exam  Constitutional: She is oriented to person, place, and time. She appears well-developed and well-nourished.  HENT:  Head: Normocephalic.  Right Ear: Hearing, tympanic membrane, external ear and ear canal normal.  Left Ear: Hearing, tympanic membrane, external ear and ear canal normal.  Nose: Nose normal.  Mouth/Throat: Uvula is midline and oropharynx is clear and moist.  Eyes: Conjunctivae and EOM are normal. Pupils are equal, round, and reactive to light.  Neck: Normal range of motion and full passive range of motion without pain. Neck supple. No JVD present. Carotid bruit is not present. No thyroid mass and no thyromegaly present.  Cardiovascular: Normal rate, regular rhythm, normal heart sounds and intact distal pulses.   No murmur heard. Pulmonary/Chest: Effort normal and breath sounds normal. Right breast exhibits no inverted nipple, no mass, no nipple discharge, no skin change and no tenderness. Left breast exhibits no inverted nipple, no mass, no nipple discharge, no skin change and no tenderness.  Abdominal: Soft. Bowel sounds are normal. She exhibits no mass. There is no tenderness.  Genitourinary: Vagina normal and uterus normal. No breast swelling, tenderness, discharge or bleeding.  bimanual exam-No adnexal masses or tenderness.  Non-parous cervix   Musculoskeletal: Normal range of motion.  Lymphadenopathy:    She has no cervical adenopathy.  Neurological: She is alert  and oriented to person, place, and time.  Skin: Skin is warm and dry.  Psychiatric: She has a normal mood and affect. Her behavior is normal. Judgment and thought content normal.   BP 111/65 mmHg  Pulse 66  Temp(Src) 98.9 F (37.2 C) (Oral)  Ht _0  (1.626 m)  Wt 132 lb (59.875 kg)  BMI 22.65 kg/m2       Assessment & Plan:   1. Annual physical exam - POCT urinalysis dipstick - POCT UA - Microscopic Only - Thyroid Panel With TSH - CBC with Differential/Platelet - CMP14+EGFR - NMR, lipoprofile - HIV antibody  2. Encounter for other contraceptive management - drospirenone-ethinyl estradiol (GIANVI) 3-0.02 MG tablet; TAKE AS DIRECTED  Dispense: 28 tablet; Refill: 11  3. Encounter for routine gynecological examination - Pap IG, CT/NG w/ reflex HPV when ASC-U  4. GAD (generalized anxiety disorder) Stress management  Follow up in 6 moths Mary-Margaret Hassell Done, FNP

## 2014-11-27 NOTE — Addendum Note (Signed)
Addended by: Prescott GumLAND, Jadalee Westcott M on: 11/27/2014 11:23 AM   Modules accepted: Kipp BroodSmartSet

## 2014-11-28 LAB — CMP14+EGFR
ALBUMIN: 4.3 g/dL (ref 3.5–5.5)
ALT: 14 IU/L (ref 0–32)
AST: 17 IU/L (ref 0–40)
Albumin/Globulin Ratio: 1.7 (ref 1.1–2.5)
Alkaline Phosphatase: 61 IU/L (ref 39–117)
BILIRUBIN TOTAL: 0.5 mg/dL (ref 0.0–1.2)
BUN/Creatinine Ratio: 12 (ref 8–20)
BUN: 9 mg/dL (ref 6–20)
CO2: 26 mmol/L (ref 18–29)
CREATININE: 0.75 mg/dL (ref 0.57–1.00)
Calcium: 9.6 mg/dL (ref 8.7–10.2)
Chloride: 99 mmol/L (ref 97–108)
GFR, EST AFRICAN AMERICAN: 129 mL/min/{1.73_m2} (ref >59)
GFR, EST NON AFRICAN AMERICAN: 112 mL/min/{1.73_m2} (ref >59)
Globulin, Total: 2.5 g/dL (ref 1.5–4.5)
Glucose: 82 mg/dL (ref 65–99)
Potassium: 4.1 mmol/L (ref 3.5–5.2)
SODIUM: 140 mmol/L (ref 134–144)
TOTAL PROTEIN: 6.8 g/dL (ref 6.0–8.5)

## 2014-11-28 LAB — THYROID PANEL WITH TSH
FREE THYROXINE INDEX: 2.5 (ref 1.2–4.9)
T3 UPTAKE RATIO: 21 % — AB (ref 24–39)
T4 TOTAL: 11.7 ug/dL (ref 4.5–12.0)
TSH: 4.09 u[IU]/mL (ref 0.450–4.500)

## 2014-11-28 LAB — CBC WITH DIFFERENTIAL/PLATELET
Basophils Absolute: 0 10*3/uL (ref 0.0–0.2)
Basos: 1 %
EOS: 5 %
Eosinophils Absolute: 0.3 10*3/uL (ref 0.0–0.4)
HCT: 40.3 % (ref 34.0–46.6)
Hemoglobin: 13.8 g/dL (ref 11.1–15.9)
IMMATURE GRANULOCYTES: 0 %
Immature Grans (Abs): 0 10*3/uL (ref 0.0–0.1)
LYMPHS ABS: 2.4 10*3/uL (ref 0.7–3.1)
Lymphs: 38 %
MCH: 33.3 pg — AB (ref 26.6–33.0)
MCHC: 34.2 g/dL (ref 31.5–35.7)
MCV: 97 fL (ref 79–97)
MONOS ABS: 0.4 10*3/uL (ref 0.1–0.9)
Monocytes: 6 %
NEUTROS PCT: 50 %
Neutrophils Absolute: 3.2 10*3/uL (ref 1.4–7.0)
Platelets: 272 10*3/uL (ref 150–379)
RBC: 4.15 x10E6/uL (ref 3.77–5.28)
RDW: 13.5 % (ref 12.3–15.4)
WBC: 6.3 10*3/uL (ref 3.4–10.8)

## 2014-11-28 LAB — NMR, LIPOPROFILE
CHOLESTEROL: 182 mg/dL (ref 100–199)
HDL CHOLESTEROL BY NMR: 83 mg/dL (ref >39)
HDL Particle Number: 45.6 umol/L (ref >=30.5)
LDL Particle Number: 1101 nmol/L — ABNORMAL HIGH (ref <1000)
LDL Size: 20.9 nm (ref >20.5)
LDL-C: 83 mg/dL (ref 0–99)
LP-IR Score: 26 (ref <=45)
Small LDL Particle Number: 305 nmol/L (ref <=527)
TRIGLYCERIDES BY NMR: 79 mg/dL (ref 0–149)

## 2014-11-28 LAB — HIV ANTIBODY (ROUTINE TESTING W REFLEX): HIV Screen 4th Generation wRfx: NONREACTIVE

## 2014-11-29 ENCOUNTER — Telehealth: Payer: Self-pay | Admitting: *Deleted

## 2014-11-29 ENCOUNTER — Telehealth: Payer: Self-pay | Admitting: Nurse Practitioner

## 2014-11-29 NOTE — Telephone Encounter (Signed)
Left results on vm. 

## 2014-11-29 NOTE — Telephone Encounter (Signed)
-----   [facility] Martin, FNP sent at 11/28/2014  1:17 PM EDT ----- Thyroid panel normal Kidney and liver function stable Cholesterol looks great HIV negative cbc normal Pap pending

## 2014-11-30 LAB — PAP IG, CT-NG, RFX HPV ASCU
Chlamydia, Nuc. Acid Amp: NEGATIVE
Gonococcus by Nucleic Acid Amp: NEGATIVE
PAP Smear Comment: 0

## 2014-12-12 ENCOUNTER — Institutional Professional Consult (permissible substitution): Payer: Self-pay | Admitting: Internal Medicine

## 2015-02-08 ENCOUNTER — Institutional Professional Consult (permissible substitution): Payer: Self-pay | Admitting: Internal Medicine

## 2015-02-27 ENCOUNTER — Ambulatory Visit (INDEPENDENT_AMBULATORY_CARE_PROVIDER_SITE_OTHER): Payer: BLUE CROSS/BLUE SHIELD | Admitting: Family

## 2015-02-27 ENCOUNTER — Encounter: Payer: Self-pay | Admitting: Family

## 2015-02-27 VITALS — BP 112/78 | HR 85 | Temp 99.2°F | Ht 64.0 in | Wt 132.8 lb

## 2015-02-27 DIAGNOSIS — N10 Acute tubulo-interstitial nephritis: Secondary | ICD-10-CM | POA: Diagnosis not present

## 2015-02-27 DIAGNOSIS — N3000 Acute cystitis without hematuria: Secondary | ICD-10-CM | POA: Diagnosis not present

## 2015-02-27 DIAGNOSIS — R509 Fever, unspecified: Secondary | ICD-10-CM

## 2015-02-27 LAB — POCT CBC
Granulocyte percent: 74.9 %G (ref 37–80)
HCT, POC: 40.4 % (ref 37.7–47.9)
Hemoglobin: 13.5 g/dL (ref 12.2–16.2)
Lymph, poc: 1.8 (ref 0.6–3.4)
MCH, POC: 31.8 pg — AB (ref 27–31.2)
MCHC: 33.3 g/dL (ref 31.8–35.4)
MCV: 95.5 fL (ref 80–97)
MPV: 8 fL (ref 0–99.8)
PLATELET COUNT, POC: 207 10*3/uL (ref 142–424)
POC Granulocyte: 6.6 (ref 2–6.9)
POC LYMPH %: 20.7 % (ref 10–50)
RBC: 4.23 M/uL (ref 4.04–5.48)
RDW, POC: 11.9 %
WBC: 8.8 10*3/uL (ref 4.6–10.2)

## 2015-02-27 LAB — POCT URINALYSIS DIPSTICK
BILIRUBIN UA: NEGATIVE
Glucose, UA: NEGATIVE
Ketones, UA: NEGATIVE
Nitrite, UA: NEGATIVE
Spec Grav, UA: 1.01
Urobilinogen, UA: NEGATIVE
pH, UA: 7

## 2015-02-27 LAB — POCT UA - MICROSCOPIC ONLY
BACTERIA, U MICROSCOPIC: NEGATIVE
CRYSTALS, UR, HPF, POC: NEGATIVE
Casts, Ur, LPF, POC: NEGATIVE
Mucus, UA: NEGATIVE
Yeast, UA: NEGATIVE

## 2015-02-27 MED ORDER — CIPROFLOXACIN HCL 500 MG PO TABS: 500.0000 mg | ORAL_TABLET | Freq: Two times a day (BID) | ORAL | Status: DC

## 2015-02-27 MED ORDER — CEFTRIAXONE SODIUM 1 G IJ SOLR
1.0000 g | Freq: Once | INTRAMUSCULAR | Status: AC
  Administered 2015-02-27: 1 g via INTRAMUSCULAR

## 2015-02-27 NOTE — Patient Instructions (Signed)
Pyelonephritis, Adult °Pyelonephritis is a kidney infection. In general, there are 2 main types of pyelonephritis: °· Infections that come on quickly without any warning (acute pyelonephritis). °· Infections that persist for a long period of time (chronic pyelonephritis). °CAUSES  °Two main causes of pyelonephritis are: °· Bacteria traveling from the bladder to the kidney. This is a problem especially in pregnant women. The urine in the bladder can become filled with bacteria from multiple causes, including: °¨ Inflammation of the prostate gland (prostatitis). °¨ Sexual intercourse in females. °¨ Bladder infection (cystitis). °· Bacteria traveling from the bloodstream to the tissue part of the kidney. °Problems that may increase your risk of getting a kidney infection include: °· Diabetes. °· Kidney stones or bladder stones. °· Cancer. °· Catheters placed in the bladder. °· Other abnormalities of the kidney or ureter. °SYMPTOMS  °· Abdominal pain. °· Pain in the side or flank area. °· Fever. °· Chills. °· Upset stomach. °· Blood in the urine (dark urine). °· Frequent urination. °· Strong or persistent urge to urinate. °· Burning or stinging when urinating. °DIAGNOSIS  °Your caregiver may diagnose your kidney infection based on your symptoms. A urine sample may also be taken. °TREATMENT  °In general, treatment depends on how severe the infection is.  °· If the infection is mild and caught early, your caregiver may treat you with oral antibiotics and send you home. °· If the infection is more severe, the bacteria may have gotten into the bloodstream. This will require intravenous (IV) antibiotics and a hospital stay. Symptoms may include: °¨ High fever. °¨ Severe flank pain. °¨ Shaking chills. °· Even after a hospital stay, your caregiver may require you to be on oral antibiotics for a period of time. °· Other treatments may be required depending upon the cause of the infection. °HOME CARE INSTRUCTIONS  °· Take your  antibiotics as directed. Finish them even if you start to feel better. °· Make an appointment to have your urine checked to make sure the infection is gone. °· Drink enough fluids to keep your urine clear or pale yellow. °· Take medicines for the bladder if you have urgency and frequency of urination as directed by your caregiver. °SEEK IMMEDIATE MEDICAL CARE IF:  °· You have a fever or persistent symptoms for more than 2-3 days. °· You have a fever and your symptoms suddenly get worse. °· You are unable to take your antibiotics or fluids. °· You develop shaking chills. °· You experience extreme weakness or fainting. °· There is no improvement after 2 days of treatment. °MAKE SURE YOU: °· Understand these instructions. °· Will watch your condition. °· Will get help right away if you are not doing well or get worse. °Document Released: 07/27/2005 Document Revised: 01/26/2012 Document Reviewed: 12/31/2010 °ExitCare® Patient Information ©2015 [employer]. This information is not intended to replace advice given to you by your health care provider. Make sure you discuss any questions you have with your health care provider. ° °

## 2015-02-27 NOTE — Progress Notes (Signed)
Subjective:    Patient ID: [name], female    DOB: 05-15-1991, 24 y.o.   MRN: [id number]  Pt reports going to Urgent Care on Monday and states she was told she had a UTI. Pt was given a Rocephin injection and a prescription for  . Pt reports feeling the same with no improvement. Pt reports feeling achy, tired, and fatigue. Pt denies any  Fever  This is a new problem. The current episode started in the past 7 days (Saturday). The problem occurs constantly. The problem has been unchanged. The maximum temperature noted was 101 to 101.9 F. Associated symptoms include coughing, headaches, muscle aches and sleepiness. Pertinent negatives include no abdominal pain, congestion, nausea, sore throat, urinary pain or vomiting. Treatments tried: Rocephin. The treatment provided mild relief.      Review of Systems  Constitutional: Positive for fever.  HENT: Negative for congestion and sore throat.   Eyes: Negative.   Respiratory: Positive for cough. Negative for shortness of breath.   Cardiovascular: Negative.  Negative for palpitations.  Gastrointestinal: Negative.  Negative for nausea, vomiting and abdominal pain.  Endocrine: Negative.   Genitourinary: Negative.  Negative for dysuria.  Musculoskeletal: Negative.   Neurological: Positive for headaches.  Hematological: Negative.   Psychiatric/Behavioral: Negative.   All other systems reviewed and are negative.      Objective:   Physical Exam  Constitutional: She is oriented to person, place, and time. She appears well-developed and well-nourished. No distress.  HENT:  Head: Normocephalic and atraumatic.  Right Ear: External ear normal.  Mouth/Throat: Oropharynx is clear and moist.  Eyes: Pupils are equal, round, and reactive to light.  Neck: Normal range of motion. Neck supple. No thyromegaly present.  Cardiovascular: Normal rate, regular rhythm, normal heart sounds and intact distal pulses.   No murmur heard. Pulmonary/Chest:  Effort normal and breath sounds normal. No respiratory distress. She has no wheezes.  Abdominal: Soft. Bowel sounds are normal. She exhibits no distension. There is no tenderness.  Musculoskeletal: Normal range of motion. She exhibits no edema or tenderness.  Right sided CVA tenderness   Neurological: She is alert and oriented to person, place, and time. She has normal reflexes. No cranial nerve deficit.  Skin: Skin is warm and dry.  Psychiatric: She has a normal mood and affect. Her behavior is normal. Judgment and thought content normal.  Vitals reviewed.  Results for orders placed or performed in visit on 02/27/15  POCT UA - Microscopic Only  Result Value Ref Range   WBC, Ur, HPF, POC 25-30    RBC, urine, microscopic 5-10    Bacteria, U Microscopic neg    Mucus, UA neg    Epithelial cells, urine per micros occ    Crystals, Ur, HPF, POC neg    Casts, Ur, LPF, POC neg    Yeast, UA neg   POCT urinalysis dipstick  Result Value Ref Range   Color, UA yellow    Clarity, UA cloudy    Glucose, UA neg    Bilirubin, UA neg    Ketones, UA neg    Spec Grav, UA 1.010    Blood, UA mod    pH, UA 7.0    Protein, UA 3+++    Urobilinogen, UA negative    Nitrite, UA neg    Leukocytes, UA moderate (2+) (A) Negative  POCT CBC  Result Value Ref Range   WBC 8.8 4.6 - 10.2 K/uL   Lymph, poc 1.8 0.6 - 3.4  POC LYMPH PERCENT 20.7 10 - 50 %L   POC Granulocyte 6.6 2 - 6.9   Granulocyte percent 74.9 37 - 80 %G   RBC 4.23 4.04 - 5.48 M/uL   Hemoglobin 13.5 12.2 - 16.2 g/dL   HCT, POC 14.740.4 82.937.7 - 47.9 %   MCV 95.5 80 - 97 fL   MCH, POC 31.8 (A) 27 - 31.2 pg   MCHC 33.3 31.8 - 35.4 g/dL   RDW, POC 56.211.9 %   Platelet Count, POC 207 142 - 424 K/uL   MPV 8.0 0 - 99.8 fL      BP 112/78 mmHg  Pulse 85  Temp(Src) 99.2 F (37.3 C) (Oral)  Ht 5\' 4"  (1.626 m)  Wt 132 lb 12.8 oz (60.238 kg)  BMI 22.78 kg/m2     Assessment & Plan:  1. Fever, unspecified fever cause - POCT UA - Microscopic  Only - POCT urinalysis dipstick - POCT CBC - Urine culture  2. Acute cystitis without hematuria - POCT UA - Microscopic Only - POCT urinalysis dipstick - POCT CBC - Urine culture  3. Acute pyelonephritis -Force fluids AZO over the counter X2 days RTO prn Culture pending - Urine culture - cefTRIAXone (ROCEPHIN) injection 1 g; Inject 1 g into the muscle once. - ciprofloxacin (CIPRO) 500 MG tablet; Take 1 tablet (500 mg total) by mouth 2 (two) times daily.  Dispense: 20 tablet; Refill: 0  Jannifer Rodneyhristy Hawks, FNP

## 2015-02-28 LAB — URINE CULTURE: Organism ID, Bacteria: NO GROWTH

## 2015-04-03 ENCOUNTER — Encounter: Payer: Self-pay | Admitting: Family Medicine

## 2015-04-03 ENCOUNTER — Ambulatory Visit (INDEPENDENT_AMBULATORY_CARE_PROVIDER_SITE_OTHER): Payer: BLUE CROSS/BLUE SHIELD | Admitting: Family Medicine

## 2015-04-03 DIAGNOSIS — J029 Acute pharyngitis, unspecified: Secondary | ICD-10-CM

## 2015-04-03 DIAGNOSIS — N3090 Cystitis, unspecified without hematuria: Secondary | ICD-10-CM

## 2015-04-03 LAB — POCT UA - MICROSCOPIC ONLY
Bacteria, U Microscopic: NEGATIVE
CASTS, UR, LPF, POC: NEGATIVE
CRYSTALS, UR, HPF, POC: NEGATIVE
MUCUS UA: NEGATIVE
RBC, urine, microscopic: NEGATIVE
WBC, Ur, HPF, POC: NEGATIVE
Yeast, UA: NEGATIVE

## 2015-04-03 LAB — POCT RAPID STREP A (OFFICE): Rapid Strep A Screen: NEGATIVE

## 2015-04-03 LAB — POCT URINALYSIS DIPSTICK
Bilirubin, UA: NEGATIVE
GLUCOSE UA: NEGATIVE
Ketones, UA: NEGATIVE
Leukocytes, UA: NEGATIVE
NITRITE UA: NEGATIVE
Protein, UA: NEGATIVE
RBC UA: NEGATIVE
Urobilinogen, UA: NEGATIVE
pH, UA: 6

## 2015-04-03 MED ORDER — AZITHROMYCIN 250 MG PO TABS: ORAL_TABLET | ORAL | Status: DC

## 2015-04-03 NOTE — Progress Notes (Signed)
Subjective:  Patient ID: [name], female    DOB: 12-30-1990  Age: 24 y.o. MRN: [id number]  CC: Sore Throat   HPI DEON IVEY presents for Symptoms include congestion, facial pain, nasal congestion, no  fever, non productive cough, post nasal drip and sinus pressure with no fever, chills, night sweats or weight loss. Onset of symptoms was a few days ago, gradually worsening since that time. Pt.is drinking moderate amounts of fluids.     History Karie has no past medical history on file.   She has no past surgical history on file.   Her family history is not on file.She reports that she quit smoking about 6 weeks ago. She does not have any smokeless tobacco history on file. Her alcohol and drug histories are not on file.  Outpatient Prescriptions Prior to Visit  Medication Sig Dispense Refill  . citalopram (CELEXA) 20 MG tablet Take 20 mg by mouth daily.    . drospirenone-ethinyl estradiol (GIANVI) 3-0.02 MG tablet TAKE AS DIRECTED 28 tablet 11  . ciprofloxacin (CIPRO) 500 MG tablet Take 1 tablet (500 mg total) by mouth 2 (two) times daily. (Patient not taking: Reported on 04/03/2015) 20 tablet 0   No facility-administered medications prior to visit.    ROS Review of Systems  Constitutional: Negative for fever, chills, activity change and appetite change.  HENT: Positive for congestion, postnasal drip, rhinorrhea and sinus pressure. Negative for ear discharge, ear pain, hearing loss, nosebleeds, sneezing and trouble swallowing.   Respiratory: Negative for chest tightness and shortness of breath.   Cardiovascular: Negative for chest pain and palpitations.  Genitourinary: Positive for dysuria and frequency.  Skin: Negative for rash.    Objective:  BP 118/68 mmHg  Pulse 79  Temp(Src) 98.9 F (37.2 C) (Oral)  Wt 136 lb (61.689 kg)  BP Readings from Last 3 Encounters:  04/03/15 118/68  02/27/15 112/78  11/27/14 111/65    Wt Readings from Last 3 Encounters:    04/03/15 136 lb (61.689 kg)  02/27/15 132 lb 12.8 oz (60.238 kg)  11/27/14 132 lb (59.875 kg)     Physical Exam  Constitutional: She appears well-developed and well-nourished.  HENT:  Head: Normocephalic and atraumatic.  Right Ear: Tympanic membrane and external ear normal. No decreased hearing is noted.  Left Ear: Tympanic membrane and external ear normal. No decreased hearing is noted.  Nose: Mucosal edema present. Right sinus exhibits no frontal sinus tenderness. Left sinus exhibits no frontal sinus tenderness.  Mouth/Throat: No oropharyngeal exudate or posterior oropharyngeal erythema.  Neck: No Brudzinski's sign noted.  Pulmonary/Chest: Breath sounds normal. No respiratory distress.  Lymphadenopathy:       Head (right side): No preauricular adenopathy present.       Head (left side): No preauricular adenopathy present.       Right cervical: No superficial cervical adenopathy present.      Left cervical: No superficial cervical adenopathy present.    No results found for: HGBA1C  Lab Results  Component Value Date   WBC 8.8 02/27/2015   HGB 13.5 02/27/2015   HCT 40.4 02/27/2015   PLT 272 11/27/2014   GLUCOSE 82 11/27/2014   CHOL 182 11/27/2014   TRIG 79 11/27/2014   HDL 83 11/27/2014   ALT 14 11/27/2014   AST 17 11/27/2014   NA 140 11/27/2014   K 4.1 11/27/2014   CL 99 11/27/2014   CREATININE 0.75 11/27/2014   BUN 9 11/27/2014   CO2 26 11/27/2014  TSH 4.090 11/27/2014    No results found.  Assessment & Plan:   Delainy was seen today for sore throat.  Diagnoses and all orders for this visit:  Throat soreness -     POCT rapid strep A -     Cancel: POCT rapid strep A  Cystitis -     POCT UA - Microscopic Only -     POCT urinalysis dipstick  Other orders -     azithromycin (ZITHROMAX Z-PAK) 250 MG tablet; Take two right away Then one a day for the next 4 days.   I have discontinued Ms. Beville's ciprofloxacin. I am also having her start on  azithromycin. Additionally, I am having her maintain her citalopram and drospirenone-ethinyl estradiol.  Meds ordered this encounter  Medications  . azithromycin (ZITHROMAX Z-PAK) 250 MG tablet    Sig: Take two right away Then one a day for the next 4 days.    Dispense:  6 each    Refill:  0     Follow-up: Return if symptoms worsen or fail to improve.  Mechele Claude, M.D.

## 2015-08-26 ENCOUNTER — Other Ambulatory Visit: Payer: Self-pay | Admitting: Family Medicine

## 2015-08-26 DIAGNOSIS — Z308 Encounter for other contraceptive management: Secondary | ICD-10-CM

## 2015-08-26 MED ORDER — DROSPIRENONE-ETHINYL ESTRADIOL 3-0.02 MG PO TABS: ORAL_TABLET | ORAL | Status: DC

## 2015-08-26 NOTE — Telephone Encounter (Signed)
Patient aware rx sent to pharmacy.  

## 2016-02-10 ENCOUNTER — Other Ambulatory Visit: Payer: Self-pay | Admitting: Nurse Practitioner

## 2016-02-10 NOTE — Telephone Encounter (Signed)
Last seen 04/05/15  Dr Stacks 

## 2016-05-06 ENCOUNTER — Other Ambulatory Visit: Payer: Self-pay | Admitting: Family Medicine

## 2016-05-06 NOTE — Telephone Encounter (Signed)
Patient aware, birth control medication sent to mail order pharmacy.

## 2016-05-07 ENCOUNTER — Encounter: Payer: Self-pay | Admitting: Nurse Practitioner

## 2016-05-07 ENCOUNTER — Ambulatory Visit (INDEPENDENT_AMBULATORY_CARE_PROVIDER_SITE_OTHER): Payer: BLUE CROSS/BLUE SHIELD | Admitting: Nurse Practitioner

## 2016-05-07 VITALS — BP 107/68 | HR 65 | Temp 97.6°F | Ht 64.0 in | Wt 138.0 lb

## 2016-05-07 DIAGNOSIS — Z0000 Encounter for general adult medical examination without abnormal findings: Secondary | ICD-10-CM | POA: Diagnosis not present

## 2016-05-07 DIAGNOSIS — Z01419 Encounter for gynecological examination (general) (routine) without abnormal findings: Secondary | ICD-10-CM | POA: Diagnosis not present

## 2016-05-07 DIAGNOSIS — Z23 Encounter for immunization: Secondary | ICD-10-CM

## 2016-05-07 LAB — URINALYSIS, COMPLETE
BILIRUBIN UA: NEGATIVE
GLUCOSE, UA: NEGATIVE
KETONES UA: NEGATIVE
LEUKOCYTES UA: NEGATIVE
Nitrite, UA: NEGATIVE
PROTEIN UA: NEGATIVE
RBC UA: NEGATIVE
SPEC GRAV UA: 1.02 (ref 1.005–1.030)
Urobilinogen, Ur: 0.2 mg/dL (ref 0.2–1.0)
pH, UA: 6 (ref 5.0–7.5)

## 2016-05-07 LAB — MICROSCOPIC EXAMINATION: RBC, UA: NONE SEEN /hpf (ref 0–?)

## 2016-05-07 MED ORDER — DROSPIRENONE-ETHINYL ESTRADIOL 3-0.02 MG PO TABS: ORAL_TABLET | ORAL | 3 refills | Status: DC

## 2016-05-07 NOTE — Progress Notes (Signed)
   Subjective:    Patient ID: Kayla Stone, female    DOB: [birth year], 25 y.o.   MRN: [id number]  HPI  Patient is here today for CPE with pap. No acute complaint. She is doing well.  GAD Currently not taking anything and is doing well.     Review of Systems  Constitutional: Negative.   HENT: Negative.   Respiratory: Negative.   Cardiovascular: Negative.   Gastrointestinal: Negative.   Genitourinary: Negative.   Neurological: Negative.   Psychiatric/Behavioral: Negative.   All other systems reviewed and are negative.      Objective:   Physical Exam  Constitutional: She is oriented to person, place, and time. She appears well-developed and well-nourished.  HENT:  Head: Normocephalic.  Right Ear: Hearing, tympanic membrane, external ear and ear canal normal.  Left Ear: Hearing, tympanic membrane, external ear and ear canal normal.  Nose: Nose normal.  Mouth/Throat: Uvula is midline and oropharynx is clear and moist.  Eyes: Conjunctivae and EOM are normal. Pupils are equal, round, and reactive to light.  Neck: Normal range of motion and full passive range of motion without pain. Neck supple. No JVD present. Carotid bruit is not present. No thyroid mass and no thyromegaly present.  Cardiovascular: Normal rate, regular rhythm, normal heart sounds and intact distal pulses.   No murmur heard. Pulmonary/Chest: Effort normal and breath sounds normal. Right breast exhibits no inverted nipple, no mass, no nipple discharge, no skin change and no tenderness. Left breast exhibits no inverted nipple, no mass, no nipple discharge, no skin change and no tenderness.  Abdominal: Soft. Bowel sounds are normal. She exhibits no mass. There is no tenderness.  Genitourinary: Vagina normal and uterus normal. No breast swelling, tenderness, discharge or bleeding.  Genitourinary Comments: bimanual exam-No adnexal masses or tenderness.  Non-parous cervix   Musculoskeletal: Normal range of motion.    Lymphadenopathy:    She has no cervical adenopathy.  Neurological: She is alert and oriented to person, place, and time.  Skin: Skin is warm and dry.  Psychiatric: She has a normal mood and affect. Her behavior is normal. Judgment and thought content normal.   BP 107/68   Pulse 65   Temp 97.6 F (36.4 C) (Oral)   Ht '5\' 4"'$  (1.626 m)   Wt 138 lb (62.6 kg)   BMI 23.69 kg/m         Assessment & Plan:  1. Annual physical exam - IGP, CtNg, rfx Aptima HPV ASCU - Urinalysis, Complete  2. Encounter for routine gynecological examination - CMP14+EGFR - Lipid panel - CBC with Differential/Platelet - Thyroid Panel With TSH    Labs pending Health maintenance reviewed Diet and exercise encouraged Continue all meds Follow up  In 1 year   [place], FNP

## 2016-05-07 NOTE — Patient Instructions (Signed)
Exercising to Stay Healthy Exercising regularly is important. It has many health benefits, such as:  Improving your overall fitness, flexibility, and endurance.  Increasing your bone density.  Helping with weight control.  Decreasing your body fat.  Increasing your muscle strength.  Reducing stress and tension.  Improving your overall health. In order to become healthy and stay healthy, it is recommended that you do moderate-intensity and vigorous-intensity exercise. You can tell that you are exercising at a moderate intensity if you have a higher heart rate and faster breathing, but you are still able to hold a conversation. You can tell that you are exercising at a vigorous intensity if you are breathing much harder and faster and cannot hold a conversation while exercising. HOW OFTEN SHOULD I EXERCISE? Choose an activity that you enjoy and set realistic goals. Your health care provider can help you to make an activity plan that works for you. Exercise regularly as directed by your health care provider. This may include:   Doing resistance training twice each week, such as:  Push-ups.  Sit-ups.  Lifting weights.  Using resistance bands.  Doing a given intensity of exercise for a given amount of time. Choose from these options:  150 minutes of moderate-intensity exercise every week.  75 minutes of vigorous-intensity exercise every week.  A mix of moderate-intensity and vigorous-intensity exercise every week. Children, pregnant women, people who are out of shape, people who are overweight, and older adults may need to consult a health care provider for individual recommendations. If you have any sort of medical condition, be sure to consult your health care provider before starting a new exercise program.  WHAT ARE SOME EXERCISE IDEAS? Some moderate-intensity exercise ideas include:   Walking at a rate of 1 mile in 15  minutes.  Biking.  Hiking.  Golfing.  Dancing. Some vigorous-intensity exercise ideas include:   Walking at a rate of at least 4.5 miles per hour.  Jogging or running at a rate of 5 miles per hour.  Biking at a rate of at least 10 miles per hour.  Lap swimming.  Roller-skating or in-line skating.  Cross-country skiing.  Vigorous competitive sports, such as football, basketball, and soccer.  Jumping rope.  Aerobic dancing. WHAT ARE SOME EVERYDAY ACTIVITIES THAT CAN HELP ME TO GET EXERCISE?  Yard work, such as:  Pushing a lawn mower.  Raking and bagging leaves.  Washing and waxing your car.  Pushing a stroller.  Shoveling snow.  Gardening.  Washing windows or floors. HOW CAN I BE MORE ACTIVE IN MY DAY-TO-DAY ACTIVITIES?  Use the stairs instead of the elevator.  Take a walk during your lunch break.  If you drive, park your car farther away from work or school.  If you take public transportation, get off one stop early and walk the rest of the way.  Make all of your phone calls while standing up and walking around.  Get up, stretch, and walk around every 30 minutes throughout the day. WHAT GUIDELINES SHOULD I FOLLOW WHILE EXERCISING?  Do not exercise so much that you hurt yourself, feel dizzy, or get very short of breath.  Consult your health care provider before starting a new exercise program.  Wear comfortable clothes and shoes with good support.  Drink plenty of water while you exercise to prevent dehydration or heat stroke. Body water is lost during exercise and must be replaced.  Work out until you breathe faster and your heart beats faster.   This information   is not intended to replace advice given to you by your health care provider. Make sure you discuss any questions you have with your health care provider.   Document Released: 08/29/2010 Document Revised: 08/17/2014 Document Reviewed: 12/28/2013 Elsevier Interactive Patient Education  2016 [employer]  

## 2016-05-07 NOTE — Addendum Note (Signed)
Addended by: Cleda DaubUCKER, Nusayba Cadenas G on: 05/07/2016 03:58 PM   Modules accepted: Orders

## 2016-05-08 LAB — CBC WITH DIFFERENTIAL/PLATELET
BASOS ABS: 0 10*3/uL (ref 0.0–0.2)
Basos: 0 %
EOS (ABSOLUTE): 0.3 10*3/uL (ref 0.0–0.4)
EOS: 4 %
HEMATOCRIT: 42.9 % (ref 34.0–46.6)
HEMOGLOBIN: 14.3 g/dL (ref 11.1–15.9)
IMMATURE GRANS (ABS): 0 10*3/uL (ref 0.0–0.1)
IMMATURE GRANULOCYTES: 0 %
LYMPHS: 34 %
Lymphocytes Absolute: 2.4 10*3/uL (ref 0.7–3.1)
MCH: 32.4 pg (ref 26.6–33.0)
MCHC: 33.3 g/dL (ref 31.5–35.7)
MCV: 97 fL (ref 79–97)
MONOCYTES: 5 %
Monocytes Absolute: 0.3 10*3/uL (ref 0.1–0.9)
NEUTROS PCT: 57 %
Neutrophils Absolute: 4 10*3/uL (ref 1.4–7.0)
Platelets: 276 10*3/uL (ref 150–379)
RBC: 4.41 x10E6/uL (ref 3.77–5.28)
RDW: 12.9 % (ref 12.3–15.4)
WBC: 7 10*3/uL (ref 3.4–10.8)

## 2016-05-08 LAB — LIPID PANEL
CHOL/HDL RATIO: 3 ratio (ref 0.0–4.4)
CHOLESTEROL TOTAL: 172 mg/dL (ref 100–199)
HDL: 57 mg/dL (ref >39)
LDL Calculated: 97 mg/dL (ref 0–99)
TRIGLYCERIDES: 91 mg/dL (ref 0–149)
VLDL CHOLESTEROL CAL: 18 mg/dL (ref 5–40)

## 2016-05-08 LAB — CMP14+EGFR
A/G RATIO: 1.8 (ref 1.2–2.2)
ALT: 13 IU/L (ref 0–32)
AST: 15 IU/L (ref 0–40)
Albumin: 4.7 g/dL (ref 3.5–5.5)
Alkaline Phosphatase: 63 IU/L (ref 39–117)
BUN/Creatinine Ratio: 16 (ref 9–23)
BUN: 11 mg/dL (ref 6–20)
CHLORIDE: 99 mmol/L (ref 96–106)
CO2: 24 mmol/L (ref 18–29)
Calcium: 9.8 mg/dL (ref 8.7–10.2)
Creatinine, Ser: 0.69 mg/dL (ref 0.57–1.00)
GFR calc non Af Amer: 121 mL/min/{1.73_m2} (ref >59)
GFR, EST AFRICAN AMERICAN: 140 mL/min/{1.73_m2} (ref >59)
GLUCOSE: 89 mg/dL (ref 65–99)
Globulin, Total: 2.6 g/dL (ref 1.5–4.5)
POTASSIUM: 4.1 mmol/L (ref 3.5–5.2)
Sodium: 139 mmol/L (ref 134–144)
TOTAL PROTEIN: 7.3 g/dL (ref 6.0–8.5)

## 2016-05-08 LAB — THYROID PANEL WITH TSH
Free Thyroxine Index: 1.9 (ref 1.2–4.9)
T3 UPTAKE RATIO: 27 % (ref 24–39)
T4 TOTAL: 6.9 ug/dL (ref 4.5–12.0)
TSH: 3 u[IU]/mL (ref 0.450–4.500)

## 2016-05-12 LAB — IGP, CTNG, RFX APTIMA HPV ASCU
Chlamydia, Nuc. Acid Amp: NEGATIVE
GONOCOCCUS BY NUCLEIC ACID AMP: NEGATIVE
PAP SMEAR COMMENT: 0

## 2016-05-26 ENCOUNTER — Ambulatory Visit (INDEPENDENT_AMBULATORY_CARE_PROVIDER_SITE_OTHER): Payer: BLUE CROSS/BLUE SHIELD | Admitting: Family

## 2016-05-26 ENCOUNTER — Encounter: Payer: Self-pay | Admitting: Family

## 2016-05-26 VITALS — BP 125/74 | HR 79 | Temp 97.2°F | Ht 64.0 in | Wt 138.0 lb

## 2016-05-26 DIAGNOSIS — Z3201 Encounter for pregnancy test, result positive: Secondary | ICD-10-CM

## 2016-05-26 DIAGNOSIS — Z3A01 Less than 8 weeks gestation of pregnancy: Secondary | ICD-10-CM

## 2016-05-26 DIAGNOSIS — N926 Irregular menstruation, unspecified: Secondary | ICD-10-CM

## 2016-05-26 LAB — PREGNANCY, URINE: PREG TEST UR: POSITIVE — AB

## 2016-05-26 NOTE — Progress Notes (Signed)
   Subjective:    Patient ID: Kayla Stone, female    DOB: 02/06/1991, 25 y.o.   MRN: [id number]  HPI Pt presents to the office today with amenorrhea. PT states she has had a positive pregnancy at home. Pt states her last menstrual cycle was around  04/25/16. Pt is currently on OC and denies missing any dosages, but does admit to taking a different times of the day. PT denies any nausea, vomiting, but does have breast tenderness.    Review of Systems  All other systems reviewed and are negative.      Objective:   Physical Exam  Constitutional: She is oriented to person, place, and time. She appears well-developed and well-nourished. No distress.  HENT:  Head: Normocephalic.  Eyes: Pupils are equal, round, and reactive to light.  Neck: Normal range of motion. Neck supple. No thyromegaly present.  Cardiovascular: Normal rate, regular rhythm, normal heart sounds and intact distal pulses.   No murmur heard. Pulmonary/Chest: Effort normal and breath sounds normal. No respiratory distress. She has no wheezes.  Abdominal: Soft. Bowel sounds are normal. She exhibits no distension. There is no tenderness.  Musculoskeletal: Normal range of motion. She exhibits no edema or tenderness.  Neurological: She is alert and oriented to person, place, and time.  Skin: Skin is warm and dry.  Psychiatric: She has a normal mood and affect. Her behavior is normal. Judgment and thought content normal.  Vitals reviewed.     BP 125/74   Pulse 79   Temp 97.2 F (36.2 C) (Oral)   Ht 5\' 4"  (1.626 m)   Wt 138 lb (62.6 kg)   BMI 23.69 kg/m      Assessment & Plan:  1. Missed period - Pregnancy, urine - Ambulatory referral to Obstetrics / Gynecology  2. Less than [redacted] weeks gestation of pregnancy - Ambulatory referral to Obstetrics / Gynecology  3. Positive pregnancy test - Ambulatory referral to Obstetrics / Gynecology  Discussed stop smoking, drinking alcohol, balanced diet, force fluids, no OTC  medications Referral to GYN RTO prn   Jannifer Rodneyhristy Janeliz Prestwood, FNP

## 2016-05-26 NOTE — Patient Instructions (Signed)
First Trimester of Pregnancy The first trimester of pregnancy is from week 1 until the end of week 12 (months 1 through 3). A week after a sperm fertilizes an egg, the egg will implant on the wall of the uterus. This embryo will begin to develop into a baby. Genes from you and your partner are forming the baby. The female genes determine whether the baby is a boy or a girl. At 6-8 weeks, the eyes and face are formed, and the heartbeat can be seen on ultrasound. At the end of 12 weeks, all the baby's organs are formed.  Now that you are pregnant, you will want to do everything you can to have a healthy baby. Two of the most important things are to get good prenatal care and to follow your health care provider's instructions. Prenatal care is all the medical care you receive before the baby's birth. This care will help prevent, find, and treat any problems during the pregnancy and childbirth. BODY CHANGES Your body goes through many changes during pregnancy. The changes vary from woman to woman.   You may gain or lose a couple of pounds at first.  You may feel sick to your stomach (nauseous) and throw up (vomit). If the vomiting is uncontrollable, call your health care provider.  You may tire easily.  You may develop headaches that can be relieved by medicines approved by your health care provider.  You may urinate more often. Painful urination may mean you have a bladder infection.  You may develop heartburn as a result of your pregnancy.  You may develop constipation because certain hormones are causing the muscles that push waste through your intestines to slow down.  You may develop hemorrhoids or swollen, bulging veins (varicose veins).  Your breasts may begin to grow larger and become tender. Your nipples may stick out more, and the tissue that surrounds them (areola) may become darker.  Your gums may bleed and may be sensitive to brushing and flossing.  Dark spots or blotches (chloasma,  mask of pregnancy) may develop on your face. This will likely fade after the baby is born.  Your menstrual periods will stop.  You may have a loss of appetite.  You may develop cravings for certain kinds of food.  You may have changes in your emotions from day to day, such as being excited to be pregnant or being concerned that something may go wrong with the pregnancy and baby.  You may have more vivid and strange dreams.  You may have changes in your hair. These can include thickening of your hair, rapid growth, and changes in texture. Some women also have hair loss during or after pregnancy, or hair that feels dry or thin. Your hair will most likely return to normal after your baby is born. WHAT TO EXPECT AT YOUR PRENATAL VISITS During a routine prenatal visit:  You will be weighed to make sure you and the baby are growing normally.  Your blood pressure will be taken.  Your abdomen will be measured to track your baby's growth.  The fetal heartbeat will be listened to starting around week 10 or 12 of your pregnancy.  Test results from any previous visits will be discussed. Your health care provider may ask you:  How you are feeling.  If you are feeling the baby move.  If you have had any abnormal symptoms, such as leaking fluid, bleeding, severe headaches, or abdominal cramping.  If you are using any tobacco products,   including cigarettes, chewing tobacco, and electronic cigarettes.  If you have any questions. Other tests that may be performed during your first trimester include:  Blood tests to find your blood type and to check for the presence of any previous infections. They will also be used to check for low iron levels (anemia) and Rh antibodies. Later in the pregnancy, blood tests for diabetes will be done along with other tests if problems develop.  Urine tests to check for infections, diabetes, or protein in the urine.  An ultrasound to confirm the proper growth  and development of the baby.  An amniocentesis to check for possible genetic problems.  Fetal screens for spina bifida and Down syndrome.  You may need other tests to make sure you and the baby are doing well.  HIV (human immunodeficiency virus) testing. Routine prenatal testing includes screening for HIV, unless you choose not to have this test. HOME CARE INSTRUCTIONS  Medicines  Follow your health care provider's instructions regarding medicine use. Specific medicines may be either safe or unsafe to take during pregnancy.  Take your prenatal vitamins as directed.  If you develop constipation, try taking a stool softener if your health care provider approves. Diet  Eat regular, well-balanced meals. Choose a variety of foods, such as meat or vegetable-based protein, fish, milk and low-fat dairy products, vegetables, fruits, and whole grain breads and cereals. Your health care provider will help you determine the amount of weight gain that is right for you.  Avoid raw meat and uncooked cheese. These carry germs that can cause birth defects in the baby.  Eating four or five small meals rather than three large meals a day may help relieve nausea and vomiting. If you start to feel nauseous, eating a few soda crackers can be helpful. Drinking liquids between meals instead of during meals also seems to help nausea and vomiting.  If you develop constipation, eat more high-fiber foods, such as fresh vegetables or fruit and whole grains. Drink enough fluids to keep your urine clear or pale yellow. Activity and Exercise  Exercise only as directed by your health care provider. Exercising will help you:  Control your weight.  Stay in shape.  Be prepared for labor and delivery.  Experiencing pain or cramping in the lower abdomen or low back is a good sign that you should stop exercising. Check with your health care provider before continuing normal exercises.  Try to avoid standing for long  periods of time. Move your legs often if you must stand in one place for a long time.  Avoid heavy lifting.  Wear low-heeled shoes, and practice good posture.  You may continue to have sex unless your health care provider directs you otherwise. Relief of Pain or Discomfort  Wear a good support bra for breast tenderness.   Take warm sitz baths to soothe any pain or discomfort caused by hemorrhoids. Use hemorrhoid cream if your health care provider approves.   Rest with your legs elevated if you have leg cramps or low back pain.  If you develop varicose veins in your legs, wear support hose. Elevate your feet for 15 minutes, 3-4 times a day. Limit salt in your diet. Prenatal Care  Schedule your prenatal visits by the twelfth week of pregnancy. They are usually scheduled monthly at first, then more often in the last 2 months before delivery.  Write down your questions. Take them to your prenatal visits.  Keep all your prenatal visits as directed by your   health care provider. Safety  Wear your seat belt at all times when driving.  Make a list of emergency phone numbers, including numbers for family, friends, the hospital, and police and fire departments. General Tips  Ask your health care provider for a referral to a local prenatal education class. Begin classes no later than at the beginning of month 6 of your pregnancy.  Ask for help if you have counseling or nutritional needs during pregnancy. Your health care provider can offer advice or refer you to specialists for help with various needs.  Do not use hot tubs, steam rooms, or saunas.  Do not douche or use tampons or scented sanitary pads.  Do not cross your legs for long periods of time.  Avoid cat litter boxes and soil used by cats. These carry germs that can cause birth defects in the baby and possibly loss of the fetus by miscarriage or stillbirth.  Avoid all smoking, herbs, alcohol, and medicines not prescribed by  your health care provider. Chemicals in these affect the formation and growth of the baby.  Do not use any tobacco products, including cigarettes, chewing tobacco, and electronic cigarettes. If you need help quitting, ask your health care provider. You may receive counseling support and other resources to help you quit.  Schedule a dentist appointment. At home, brush your teeth with a soft toothbrush and be gentle when you floss. SEEK MEDICAL CARE IF:   You have dizziness.  You have mild pelvic cramps, pelvic pressure, or nagging pain in the abdominal area.  You have persistent nausea, vomiting, or diarrhea.  You have a bad smelling vaginal discharge.  You have pain with urination.  You notice increased swelling in your face, hands, legs, or ankles. SEEK IMMEDIATE MEDICAL CARE IF:   You have a fever.  You are leaking fluid from your vagina.  You have spotting or bleeding from your vagina.  You have severe abdominal cramping or pain.  You have rapid weight gain or loss.  You vomit blood or material that looks like coffee grounds.  You are exposed to German measles and have never had them.  You are exposed to fifth disease or chickenpox.  You develop a severe headache.  You have shortness of breath.  You have any kind of trauma, such as from a fall or a car accident.   This information is not intended to replace advice given to you by your health care provider. Make sure you discuss any questions you have with your health care provider.   Document Released: 07/21/2001 Document Revised: 08/17/2014 Document Reviewed: 06/06/2013 Elsevier Interactive Patient Education 2016 [employer] Inc.  

## 2016-11-09 ENCOUNTER — Ambulatory Visit (INDEPENDENT_AMBULATORY_CARE_PROVIDER_SITE_OTHER): Payer: BLUE CROSS/BLUE SHIELD | Admitting: Family

## 2016-11-09 ENCOUNTER — Encounter: Payer: Self-pay | Admitting: Family

## 2016-11-09 VITALS — BP 109/70 | HR 74 | Temp 99.3°F | Ht 64.0 in | Wt 133.4 lb

## 2016-11-09 DIAGNOSIS — W57XXXA Bitten or stung by nonvenomous insect and other nonvenomous arthropods, initial encounter: Secondary | ICD-10-CM | POA: Diagnosis not present

## 2016-11-09 DIAGNOSIS — S2090XA Unspecified superficial injury of unspecified parts of thorax, initial encounter: Secondary | ICD-10-CM | POA: Diagnosis not present

## 2016-11-09 MED ORDER — DOXYCYCLINE HYCLATE 100 MG PO TABS: 200.0000 mg | ORAL_TABLET | Freq: Once | ORAL | 0 refills | Status: AC

## 2016-11-09 NOTE — Progress Notes (Signed)
   Subjective:    Patient ID: Kayla Stone, female    DOB: 1991-07-10, 26 y.o.   MRN: [id number]  HPI Pt presents to the office today for a tick bite. PT states she noticed the tick on her right upper back Friday. Pt does not know how long it was attached, but states she does not believe for every long. Pt states the site is erythemas and "itches really bad". Denies any fever or joint pain.    Review of Systems  All other systems reviewed and are negative.      Objective:   Physical Exam  Constitutional: She is oriented to person, place, and time. She appears well-developed and well-nourished. No distress.  HENT:  Head: Normocephalic.  Eyes: Pupils are equal, round, and reactive to light.  Neck: Normal range of motion. Neck supple. No thyromegaly present.  Cardiovascular: Normal rate, regular rhythm, normal heart sounds and intact distal pulses.   No murmur heard. Pulmonary/Chest: Effort normal and breath sounds normal. No respiratory distress. She has no wheezes.  Abdominal: Soft. Bowel sounds are normal. She exhibits no distension. There is no tenderness.  Musculoskeletal: Normal range of motion. She exhibits no edema or tenderness.  Neurological: She is alert and oriented to person, place, and time.  Skin: Skin is warm and dry. There is erythema.  Right erythemas papule of 2.5 cm X 1 cm  Psychiatric: She has a normal mood and affect. Her behavior is normal. Judgment and thought content normal.  Vitals reviewed.   BP 109/70   Pulse 74   Temp 99.3 F (37.4 C) (Oral)   Ht  (1.626 m)   Wt 133 lb 6.4 oz (60.5 kg)   BMI 22.90 kg/m       Assessment & Plan:  1. Tick bite, initial encounter -Pt to report any new fever, joint pain, or rash -Wear protective clothing while outside- Long sleeves and long pants -Put insect repellent on all exposed skin and along clothing -Take a shower as soon as possible after being outside -RTO prn  - doxycycline (VIBRA-TABS) 100 MG  tablet; Take 2 tablets (200 mg total) by mouth once.  Dispense: 2 tablet; Refill: 0   Jannifer Rodney, FNP

## 2016-11-09 NOTE — Patient Instructions (Signed)
Tick Bite Information, Adult Ticks are insects that draw blood for food. Most ticks live in shrubs and grassy areas. They climb onto people and animals that brush against the leaves and grasses that they rest on. Then they bite, attaching themselves to the skin. Most ticks are harmless, but some ticks carry germs that can spread to a person through a bite and cause a disease. To reduce your risk of getting a disease from a tick bite, it is important to take steps to prevent tick bites. It is also important to check for ticks after being outdoors. If you find that a tick has attached to you, watch for symptoms of disease. How can I prevent tick bites? Take these steps to help prevent tick bites when you are outdoors in an area where ticks are found:  Use insect repellent that has DEET (20% or higher), picaridin, or IR3535 in it. Use it on:  Skin that is showing.  The top of your boots.  Your pant legs.  Your sleeve cuffs.  For repellent products that contain permethrin, follow product instructions. Use these products on:  Clothing.  Gear.  Boots.  Tents.  Wear protective clothing. Long sleeves and long pants offer the best protection from ticks.  Wear light-colored clothing so you can see ticks more easily.  Tuck your pant legs into your socks.  If you go walking on a trail, stay in the middle of the trail so your skin, hair, and clothing do not touch the bushes.  Avoid walking through areas with long grass.  Check for ticks on your clothing, hair, and skin often while you are outside, and check again before you go inside. Make sure to check the places that ticks attach themselves most often. These places include the scalp, neck, armpits, waist, groin, and joint areas. Ticks that carry a disease called Lyme disease have to be attached to the skin for 24-48 hours. Checking for ticks every day will lessen your risk of this and other diseases.  When you come indoors, wash your  clothes and take a shower or a bath right away. Dry your clothes in a dryer on high heat for at least 60 minutes. This will kill any ticks in your clothes. What is the proper way to remove a tick? If you find a tick on your body, remove it as soon as possible. Removing a tick sooner rather than later can prevent germs from passing from the tick to your body. To remove a tick that is crawling on your skin but has not bitten:  Go outdoors and brush the tick off.  Remove the tick with tape or a lint roller. To remove a tick that is attached to your skin:  Wash your hands.  If you have latex gloves, put them on.  Use tweezers, curved forceps, or a tick-removal tool to gently grasp the tick as close to your skin and the tick's head as possible.  Gently pull with steady, upward pressure until the tick lets go. When removing the tick:  Take care to keep the tick's head attached to its body.  Do not twist or jerk the tick. This can make the tick's head or mouth break off.  Do not squeeze or crush the tick's body. This could force disease-carrying fluids from the tick into your body. Do not try to remove a tick with heat, alcohol, petroleum jelly, or fingernail polish. Using these methods can cause the tick to salivate and regurgitate into your   bloodstream, increasing your risk of getting a disease. What should I do after removing a tick?  Clean the bite area with soap and water, rubbing alcohol, or an iodine scrub.  If an antiseptic cream or ointment is available, apply a small amount to the bite site.  Wash and disinfect any instruments that you used to remove the tick. How should I dispose of a tick? To dispose of a live tick, use one of these methods:  Place it in rubbing alcohol.  Place it in a sealed bag or container.  Wrap it tightly in tape.  Flush it down the toilet. Contact a health care provider if:  You have symptoms of a disease after a tick bite. Symptoms of a  tick-borne disease can occur from moments after the tick bites to up to 30 days after a tick is removed. Symptoms include:  Muscle, joint, or bone pain.  Difficulty walking or moving your legs.  Numbness in the legs.  Paralysis.  Red rash around the tick bite area that is shaped like a target or a "bull's-eye."  Redness and swelling in the area of the tick bite.  Fever.  Repeated vomiting.  Diarrhea.  Weight loss.  Tender, swollen lymph glands.  Shortness of breath.  Cough.  Pain in the abdomen.  Headache.  Abnormal tiredness.  A change in your level of consciousness.  Confusion. Get help right away if:  You are not able to remove a tick.  A part of a tick breaks off and gets stuck in your skin.  Your symptoms get worse. Summary  Ticks may carry germs that can spread to a person through a bite and cause disease.  Wear protective clothing and use insect repellent to prevent tick bites. Follow product instructions.  If you find a tick on your body, remove it as soon as possible. If the tick is attached, do not try to remove with heat, alcohol, petroleum jelly, or fingernail polish.  Remove the attached tick using tweezers, curved forceps, or a tick-removal tool. Gently pull with steady, upward pressure until the tick lets go. Do not twist or jerk the tick. Do not squeeze or crush the tick's body.  If you have symptoms after being bitten by a tick, contact a health care provider. This information is not intended to replace advice given to you by your health care provider. Make sure you discuss any questions you have with your health care provider. Document Released: 07/24/2000 Document Revised: 05/08/2016 Document Reviewed: 05/08/2016 Elsevier Interactive Patient Education  2017 [employer]  

## 2017-04-30 ENCOUNTER — Telehealth: Payer: Self-pay | Admitting: Family Medicine

## 2017-04-30 MED ORDER — DROSPIRENONE-ETHINYL ESTRADIOL 3-0.02 MG PO TABS: ORAL_TABLET | ORAL | 0 refills | Status: DC

## 2017-04-30 NOTE — Telephone Encounter (Signed)
done

## 2017-06-29 ENCOUNTER — Encounter: Payer: Self-pay | Admitting: Family

## 2017-06-29 ENCOUNTER — Ambulatory Visit: Payer: Self-pay | Admitting: Family

## 2017-06-29 VITALS — BP 122/70 | HR 86 | Temp 97.6°F | Ht 64.0 in | Wt 146.0 lb

## 2017-06-29 DIAGNOSIS — F17200 Nicotine dependence, unspecified, uncomplicated: Secondary | ICD-10-CM

## 2017-06-29 DIAGNOSIS — Z87891 Personal history of nicotine dependence: Secondary | ICD-10-CM | POA: Insufficient documentation

## 2017-06-29 DIAGNOSIS — J189 Pneumonia, unspecified organism: Secondary | ICD-10-CM

## 2017-06-29 MED ORDER — AZITHROMYCIN 250 MG PO TABS: ORAL_TABLET | ORAL | 0 refills | Status: DC

## 2017-06-29 MED ORDER — PREDNISONE 10 MG (21) PO TBPK: ORAL_TABLET | ORAL | 0 refills | Status: DC

## 2017-06-29 MED ORDER — DROSPIRENONE-ETHINYL ESTRADIOL 3-0.02 MG PO TABS: ORAL_TABLET | ORAL | 0 refills | Status: DC

## 2017-06-29 NOTE — Progress Notes (Signed)
   Subjective:    Patient ID: Kayla Stone, female    DOB: 07/02/1991, 26 y.o.   MRN: [id number]  Cough  This is a new problem. The current episode started 1 to 4 weeks ago. The problem has been gradually worsening. The problem occurs every few minutes. The cough is productive of sputum and productive of purulent sputum. Associated symptoms include a fever, headaches, postnasal drip and wheezing. Pertinent negatives include no chills, ear congestion, ear pain, myalgias, nasal congestion, rhinorrhea, sore throat or shortness of breath. The symptoms are aggravated by lying down. Risk factors for lung disease include smoking/tobacco exposure. She has tried rest and OTC cough suppressant for the symptoms. The treatment provided mild relief.      Review of Systems  Constitutional: Positive for fever. Negative for chills.  HENT: Positive for postnasal drip. Negative for ear pain, rhinorrhea and sore throat.   Respiratory: Positive for cough and wheezing. Negative for shortness of breath.   Musculoskeletal: Negative for myalgias.  Neurological: Positive for headaches.  All other systems reviewed and are negative.      Objective:   Physical Exam  Constitutional: She is oriented to person, place, and time. She appears well-developed and well-nourished. No distress.  HENT:  Head: Normocephalic and atraumatic.  Right Ear: External ear normal.  Left Ear: External ear normal.  Nose: Mucosal edema and rhinorrhea present.  Mouth/Throat: Posterior oropharyngeal erythema present.  Eyes: Pupils are equal, round, and reactive to light.  Neck: Normal range of motion. Neck supple. No thyromegaly present.  Cardiovascular: Normal rate, regular rhythm, normal heart sounds and intact distal pulses.  No murmur heard. Pulmonary/Chest: Effort normal and breath sounds normal. No respiratory distress. She has no wheezes.  Coarse nonproductive cough, crackles   Abdominal: Soft. Bowel sounds are normal. She  exhibits no distension. There is no tenderness.  Musculoskeletal: Normal range of motion. She exhibits no edema or tenderness.  Neurological: She is alert and oriented to person, place, and time. She has normal reflexes. No cranial nerve deficit.  Skin: Skin is warm and dry.  Psychiatric: She has a normal mood and affect. Her behavior is normal. Judgment and thought content normal.  Vitals reviewed.     BP 122/70   Pulse 86   Temp 97.6 F (36.4 C) (Oral)   Ht 5\' 4"  (1.626 m)   Wt 146 lb (66.2 kg)   BMI 25.06 kg/m      Assessment & Plan:  1. Community acquired pneumonia, unspecified laterality - Take meds as prescribed - Use a cool mist humidifier  -Use saline nose sprays frequently -Saline irrigations of the nose can be very helpful if done frequently. -Force fluids -For any cough or congestion  Use plain Mucinex- regular strength or max strength is fine -For fever or aces or pains- take tylenol or ibuprofen appropriate for age and weight. -Throat lozenges if help - azithromycin (ZITHROMAX) 250 MG tablet; Take 500 mg once, then 250 mg for four days  Dispense: 6 tablet; Refill: 0 - predniSONE (STERAPRED UNI-PAK 21 TAB) 10 MG (21) TBPK tablet; Use as directed  Dispense: 21 tablet; Refill: 0  2. Current smoker Smoking cessation discussed   Jannifer Rodneyhristy Hawks, FNP

## 2017-06-29 NOTE — Patient Instructions (Signed)

## 2018-02-17 ENCOUNTER — Ambulatory Visit: Payer: Self-pay | Admitting: Family Medicine

## 2018-03-10 DIAGNOSIS — Z3480 Encounter for supervision of other normal pregnancy, unspecified trimester: Secondary | ICD-10-CM | POA: Diagnosis not present

## 2018-03-10 DIAGNOSIS — Z113 Encounter for screening for infections with a predominantly sexual mode of transmission: Secondary | ICD-10-CM | POA: Diagnosis not present

## 2018-03-10 DIAGNOSIS — O26849 Uterine size-date discrepancy, unspecified trimester: Secondary | ICD-10-CM | POA: Diagnosis not present

## 2018-03-10 DIAGNOSIS — Z36 Encounter for antenatal screening for chromosomal anomalies: Secondary | ICD-10-CM | POA: Diagnosis not present

## 2018-03-10 LAB — OB RESULTS CONSOLE HEPATITIS B SURFACE ANTIGEN: HEP B S AG: NEGATIVE

## 2018-03-10 LAB — OB RESULTS CONSOLE RPR: RPR: NONREACTIVE

## 2018-03-10 LAB — OB RESULTS CONSOLE ABO/RH: RH Type: POSITIVE

## 2018-03-10 LAB — OB RESULTS CONSOLE RUBELLA ANTIBODY, IGM: Rubella: IMMUNE

## 2018-03-10 LAB — OB RESULTS CONSOLE ANTIBODY SCREEN: Antibody Screen: NEGATIVE

## 2018-03-10 LAB — OB RESULTS CONSOLE GC/CHLAMYDIA
Chlamydia: NEGATIVE
Gonorrhea: NEGATIVE

## 2018-03-10 LAB — OB RESULTS CONSOLE HIV ANTIBODY (ROUTINE TESTING): HIV: NONREACTIVE

## 2018-03-31 DIAGNOSIS — Z3482 Encounter for supervision of other normal pregnancy, second trimester: Secondary | ICD-10-CM | POA: Diagnosis not present

## 2018-04-20 DIAGNOSIS — Z363 Encounter for antenatal screening for malformations: Secondary | ICD-10-CM | POA: Diagnosis not present

## 2018-04-20 DIAGNOSIS — Z3480 Encounter for supervision of other normal pregnancy, unspecified trimester: Secondary | ICD-10-CM | POA: Diagnosis not present

## 2018-05-30 ENCOUNTER — Ambulatory Visit (INDEPENDENT_AMBULATORY_CARE_PROVIDER_SITE_OTHER): Payer: Self-pay

## 2018-05-30 DIAGNOSIS — Z23 Encounter for immunization: Secondary | ICD-10-CM

## 2018-06-15 DIAGNOSIS — Z3480 Encounter for supervision of other normal pregnancy, unspecified trimester: Secondary | ICD-10-CM | POA: Diagnosis not present

## 2018-06-15 DIAGNOSIS — Z23 Encounter for immunization: Secondary | ICD-10-CM | POA: Diagnosis not present

## 2018-07-19 DIAGNOSIS — Q270 Congenital absence and hypoplasia of umbilical artery: Secondary | ICD-10-CM | POA: Diagnosis not present

## 2018-08-09 DIAGNOSIS — Q270 Congenital absence and hypoplasia of umbilical artery: Secondary | ICD-10-CM | POA: Diagnosis not present

## 2018-08-19 DIAGNOSIS — Z3480 Encounter for supervision of other normal pregnancy, unspecified trimester: Secondary | ICD-10-CM | POA: Diagnosis not present

## 2018-08-22 ENCOUNTER — Encounter (HOSPITAL_COMMUNITY): Payer: Self-pay | Admitting: *Deleted

## 2018-09-01 ENCOUNTER — Encounter (HOSPITAL_COMMUNITY)
Admission: RE | Admit: 2018-09-01 | Discharge: 2018-09-01 | Disposition: A | Discharge status: Home / Self Care | Payer: Medicaid Other | Source: Ambulatory Visit | Attending: Obstetrics | Admitting: Obstetrics

## 2018-09-01 HISTORY — DX: Other specified health status: Z78.9

## 2018-09-01 LAB — ABO/RH: ABO/RH(D): O POS

## 2018-09-01 LAB — CBC
HCT: 36.7 % (ref 36.0–46.0)
Hemoglobin: 12.4 g/dL (ref 12.0–15.0)
MCH: 32.1 pg (ref 26.0–34.0)
MCHC: 33.8 g/dL (ref 30.0–36.0)
MCV: 95.1 fL (ref 80.0–100.0)
Platelets: 224 10*3/uL (ref 150–400)
RBC: 3.86 MIL/uL — ABNORMAL LOW (ref 3.87–5.11)
RDW: 12.7 % (ref 11.5–15.5)
WBC: 9.2 10*3/uL (ref 4.0–10.5)
nRBC: 0.9 % — ABNORMAL HIGH (ref 0.0–0.2)

## 2018-09-01 LAB — TYPE AND SCREEN
ABO/RH(D): O POS
Antibody Screen: NEGATIVE

## 2018-09-01 NOTE — Patient Instructions (Signed)
PATRICA SPRATT  09/01/2018   Your procedure is scheduled on:  09/02/2018  Enter through the Main Entrance of [facility] at 0530 AM.  Pick up the phone at the desk and dial [phone]  Call this number if you have problems the morning of surgery:[phone]  Remember:   Do not eat food:(After Midnight) Desps de medianoche.  Do not drink clear liquids: (After Midnight) Desps de medianoche.  Take these medicines the morning of surgery with A SIP OF WATER: none   Do not wear jewelry, make-up or nail polish.  Do not wear lotions, powders, or perfumes. Do not wear deodorant.  Do not shave 48 hours prior to surgery.  Do not bring valuables to the hospital.  [facility] is not   responsible for any belongings or valuables brought to the hospital.  Contacts, dentures or bridgework may not be worn into surgery.  Leave suitcase in the car. After surgery it may be brought to your room.  For patients admitted to the hospital, checkout time is 11:00 AM the day of              discharge.    N/A   Please read over the following fact sheets that you were given:   Surgical Site Infection Prevention

## 2018-09-01 NOTE — Anesthesia Preprocedure Evaluation (Addendum)
Anesthesia Evaluation  Patient identified by MRN, date of birth, ID band Patient awake    Reviewed: Allergy & Precautions, NPO status , Patient's Chart, lab work & pertinent test results  History of Anesthesia Complications Negative for: history of anesthetic complications  Airway Mallampati: I  TM Distance: >3 FB Neck ROM: Full    Dental  (+) Teeth Intact, Dental Advisory Given   Pulmonary Current Smoker,    Pulmonary exam normal breath sounds clear to auscultation       Cardiovascular negative cardio ROS Normal cardiovascular exam Rhythm:Regular Rate:Normal     Neuro/Psych Anxiety negative neurological ROS     GI/Hepatic negative GI ROS, Neg liver ROS,   Endo/Other  negative endocrine ROS  Renal/GU negative Renal ROS     Musculoskeletal negative musculoskeletal ROS (+)   Abdominal   Peds  Hematology negative hematology ROS (+)   Anesthesia Other Findings Day of surgery medications reviewed with the patient.  Reproductive/Obstetrics (+) Pregnancy Breech presentation                            Anesthesia Physical Anesthesia Plan  ASA: II  Anesthesia Plan: Spinal   Post-op Pain Management:    Induction:   PONV Risk Score and Plan: 2 and Treatment may vary due to age or medical condition, Ondansetron and Dexamethasone  Airway Management Planned: Natural Airway  Additional Equipment:   Intra-op Plan:   Post-operative Plan:   Informed Consent: I have reviewed the patients History and Physical, chart, labs and discussed the procedure including the risks, benefits and alternatives for the proposed anesthesia with the patient or authorized representative who has indicated his/her understanding and acceptance.       Plan Discussed with: CRNA  Anesthesia Plan Comments:        Anesthesia Quick Evaluation

## 2018-09-02 ENCOUNTER — Inpatient Hospital Stay (HOSPITAL_COMMUNITY): Payer: Medicaid Other | Admitting: Anesthesiology

## 2018-09-02 ENCOUNTER — Encounter (HOSPITAL_COMMUNITY)
Admission: RE | Disposition: A | Discharge status: Home / Self Care | Payer: Self-pay | Source: Home / Self Care | Attending: Obstetrics

## 2018-09-02 ENCOUNTER — Inpatient Hospital Stay (HOSPITAL_COMMUNITY)
Admission: RE | Admit: 2018-09-02 | Discharge: 2018-09-04 | DRG: 788 | Disposition: A | Discharge status: Home / Self Care | Payer: Medicaid Other | Attending: Obstetrics | Admitting: Obstetrics

## 2018-09-02 ENCOUNTER — Other Ambulatory Visit: Payer: Self-pay

## 2018-09-02 ENCOUNTER — Encounter (HOSPITAL_COMMUNITY): Payer: Self-pay | Admitting: *Deleted

## 2018-09-02 DIAGNOSIS — Z87891 Personal history of nicotine dependence: Secondary | ICD-10-CM

## 2018-09-02 DIAGNOSIS — O321XX0 Maternal care for breech presentation, not applicable or unspecified: Principal | ICD-10-CM | POA: Diagnosis present

## 2018-09-02 DIAGNOSIS — Z3A39 39 weeks gestation of pregnancy: Secondary | ICD-10-CM | POA: Diagnosis not present

## 2018-09-02 DIAGNOSIS — O321XX1 Maternal care for breech presentation, fetus 1: Secondary | ICD-10-CM | POA: Diagnosis not present

## 2018-09-02 LAB — RPR: RPR Ser Ql: NONREACTIVE

## 2018-09-02 SURGERY — Surgical Case
Anesthesia: Spinal

## 2018-09-02 MED ORDER — NALBUPHINE HCL 10 MG/ML IJ SOLN: 5.0000 mg | Freq: Once | INTRAMUSCULAR | Status: DC | PRN

## 2018-09-02 MED ORDER — KETOROLAC TROMETHAMINE 30 MG/ML IJ SOLN
30.0000 mg | Freq: Four times a day (QID) | INTRAMUSCULAR | Status: DC | PRN
  Administered 2018-09-02: 30 mg via INTRAMUSCULAR

## 2018-09-02 MED ORDER — PHENYLEPHRINE 8 MG IN D5W 100 ML (0.08MG/ML) PREMIX OPTIME
INJECTION | INTRAVENOUS | Status: AC
  Filled 2018-09-02: qty 100

## 2018-09-02 MED ORDER — FENTANYL CITRATE (PF) 100 MCG/2ML IJ SOLN
INTRAMUSCULAR | Status: DC | PRN
  Administered 2018-09-02: 15 ug via INTRATHECAL

## 2018-09-02 MED ORDER — WITCH HAZEL-GLYCERIN EX PADS: 1.0000 "application " | MEDICATED_PAD | CUTANEOUS | Status: DC | PRN

## 2018-09-02 MED ORDER — NALOXONE HCL 0.4 MG/ML IJ SOLN: 0.4000 mg | INTRAMUSCULAR | Status: DC | PRN

## 2018-09-02 MED ORDER — FENTANYL CITRATE (PF) 100 MCG/2ML IJ SOLN: 25.0000 ug | INTRAMUSCULAR | Status: DC | PRN

## 2018-09-02 MED ORDER — KETOROLAC TROMETHAMINE 30 MG/ML IJ SOLN
INTRAMUSCULAR | Status: AC
  Filled 2018-09-02: qty 1

## 2018-09-02 MED ORDER — KETOROLAC TROMETHAMINE 30 MG/ML IJ SOLN
30.0000 mg | Freq: Four times a day (QID) | INTRAMUSCULAR | Status: DC | PRN
  Administered 2018-09-02 – 2018-09-03 (×2): 30 mg via INTRAVENOUS

## 2018-09-02 MED ORDER — PHENYLEPHRINE 8 MG IN D5W 100 ML (0.08MG/ML) PREMIX OPTIME
INJECTION | INTRAVENOUS | Status: DC | PRN
  Administered 2018-09-02: 60 ug/min via INTRAVENOUS

## 2018-09-02 MED ORDER — SODIUM CHLORIDE 0.9% FLUSH: 3.0000 mL | INTRAVENOUS | Status: DC | PRN

## 2018-09-02 MED ORDER — MORPHINE SULFATE-NACL 0.5-0.9 MG/ML-% IV SOSY
PREFILLED_SYRINGE | INTRAVENOUS | Status: DC | PRN
  Administered 2018-09-02: .15 mg via INTRATHECAL

## 2018-09-02 MED ORDER — SENNOSIDES-DOCUSATE SODIUM 8.6-50 MG PO TABS
2.0000 | ORAL_TABLET | ORAL | Status: DC
  Administered 2018-09-03 (×2): 2 via ORAL
  Filled 2018-09-02 (×2): qty 2

## 2018-09-02 MED ORDER — ONDANSETRON HCL 4 MG/2ML IJ SOLN
INTRAMUSCULAR | Status: AC
  Filled 2018-09-02: qty 2

## 2018-09-02 MED ORDER — FENTANYL CITRATE (PF) 100 MCG/2ML IJ SOLN
INTRAMUSCULAR | Status: AC
  Filled 2018-09-02: qty 2

## 2018-09-02 MED ORDER — OXYCODONE HCL 5 MG PO TABS: 5.0000 mg | ORAL_TABLET | ORAL | Status: DC | PRN

## 2018-09-02 MED ORDER — PRENATAL MULTIVITAMIN CH
1.0000 | ORAL_TABLET | Freq: Every day | ORAL | Status: DC
  Administered 2018-09-02 – 2018-09-04 (×3): 1 via ORAL
  Filled 2018-09-02 (×3): qty 1

## 2018-09-02 MED ORDER — DIBUCAINE 1 % RE OINT: 1.0000 "application " | TOPICAL_OINTMENT | RECTAL | Status: DC | PRN

## 2018-09-02 MED ORDER — ONDANSETRON HCL 4 MG/2ML IJ SOLN
INTRAMUSCULAR | Status: DC | PRN
  Administered 2018-09-02: 4 mg via INTRAVENOUS

## 2018-09-02 MED ORDER — CEFAZOLIN SODIUM-DEXTROSE 2-4 GM/100ML-% IV SOLN
2.0000 g | INTRAVENOUS | Status: AC
  Administered 2018-09-02: 2 g via INTRAVENOUS
  Filled 2018-09-02: qty 100

## 2018-09-02 MED ORDER — OXYTOCIN 40 UNITS IN NORMAL SALINE INFUSION - SIMPLE MED: 2.5000 [IU]/h | INTRAVENOUS | Status: DC

## 2018-09-02 MED ORDER — LACTATED RINGERS IV SOLN
INTRAVENOUS | Status: DC | PRN
  Administered 2018-09-02 (×3): via INTRAVENOUS

## 2018-09-02 MED ORDER — SIMETHICONE 80 MG PO CHEW
80.0000 mg | CHEWABLE_TABLET | ORAL | Status: DC
  Administered 2018-09-03 – 2018-09-04 (×2): 80 mg via ORAL
  Filled 2018-09-02 (×2): qty 1

## 2018-09-02 MED ORDER — SIMETHICONE 80 MG PO CHEW
80.0000 mg | CHEWABLE_TABLET | Freq: Three times a day (TID) | ORAL | Status: DC
  Administered 2018-09-03 – 2018-09-04 (×4): 80 mg via ORAL
  Filled 2018-09-02 (×6): qty 1

## 2018-09-02 MED ORDER — KETOROLAC TROMETHAMINE 30 MG/ML IJ SOLN
30.0000 mg | Freq: Four times a day (QID) | INTRAMUSCULAR | Status: DC
  Administered 2018-09-03: 30 mg via INTRAVENOUS
  Filled 2018-09-02 (×3): qty 1

## 2018-09-02 MED ORDER — BUPIVACAINE IN DEXTROSE 0.75-8.25 % IT SOLN
INTRATHECAL | Status: DC | PRN
  Administered 2018-09-02: 1.6 mg via INTRATHECAL

## 2018-09-02 MED ORDER — IBUPROFEN 800 MG PO TABS
800.0000 mg | ORAL_TABLET | Freq: Four times a day (QID) | ORAL | Status: DC
  Administered 2018-09-03: 800 mg via ORAL
  Filled 2018-09-02 (×2): qty 1

## 2018-09-02 MED ORDER — LACTATED RINGERS IV SOLN: INTRAVENOUS | Status: DC

## 2018-09-02 MED ORDER — OXYTOCIN 10 UNIT/ML IJ SOLN
INTRAMUSCULAR | Status: AC
  Filled 2018-09-02: qty 4

## 2018-09-02 MED ORDER — ONDANSETRON HCL 4 MG/2ML IJ SOLN: 4.0000 mg | Freq: Three times a day (TID) | INTRAMUSCULAR | Status: DC | PRN

## 2018-09-02 MED ORDER — DEXAMETHASONE SODIUM PHOSPHATE 10 MG/ML IJ SOLN
INTRAMUSCULAR | Status: AC
  Filled 2018-09-02: qty 1

## 2018-09-02 MED ORDER — NALBUPHINE HCL 10 MG/ML IJ SOLN: 5.0000 mg | INTRAMUSCULAR | Status: DC | PRN

## 2018-09-02 MED ORDER — NALOXONE HCL 4 MG/10ML IJ SOLN
1.0000 ug/kg/h | INTRAVENOUS | Status: DC | PRN
  Filled 2018-09-02: qty 5

## 2018-09-02 MED ORDER — DIPHENHYDRAMINE HCL 25 MG PO CAPS: 25.0000 mg | ORAL_CAPSULE | Freq: Four times a day (QID) | ORAL | Status: DC | PRN

## 2018-09-02 MED ORDER — DIPHENHYDRAMINE HCL 25 MG PO CAPS
25.0000 mg | ORAL_CAPSULE | ORAL | Status: DC | PRN
  Filled 2018-09-02: qty 1

## 2018-09-02 MED ORDER — MENTHOL 3 MG MT LOZG: 1.0000 | LOZENGE | OROMUCOSAL | Status: DC | PRN

## 2018-09-02 MED ORDER — MORPHINE SULFATE (PF) 0.5 MG/ML IJ SOLN
INTRAMUSCULAR | Status: AC
  Filled 2018-09-02: qty 10

## 2018-09-02 MED ORDER — PROMETHAZINE HCL 25 MG/ML IJ SOLN: 6.2500 mg | INTRAMUSCULAR | Status: DC | PRN

## 2018-09-02 MED ORDER — COCONUT OIL OIL
1.0000 "application " | TOPICAL_OIL | Status: DC | PRN
  Administered 2018-09-03: 1 via TOPICAL

## 2018-09-02 MED ORDER — ACETAMINOPHEN 500 MG PO TABS: 1000.0000 mg | ORAL_TABLET | Freq: Four times a day (QID) | ORAL | Status: DC

## 2018-09-02 MED ORDER — DIPHENHYDRAMINE HCL 50 MG/ML IJ SOLN: 12.5000 mg | INTRAMUSCULAR | Status: DC | PRN

## 2018-09-02 MED ORDER — KETOROLAC TROMETHAMINE 30 MG/ML IJ SOLN: 30.0000 mg | Freq: Once | INTRAMUSCULAR | Status: DC | PRN

## 2018-09-02 MED ORDER — ACETAMINOPHEN 500 MG PO TABS
1000.0000 mg | ORAL_TABLET | Freq: Four times a day (QID) | ORAL | Status: DC
  Administered 2018-09-02 – 2018-09-04 (×7): 1000 mg via ORAL
  Filled 2018-09-02 (×8): qty 2

## 2018-09-02 MED ORDER — SODIUM CHLORIDE 0.9 % IR SOLN
Status: DC | PRN
  Administered 2018-09-02: 1

## 2018-09-02 MED ORDER — SIMETHICONE 80 MG PO CHEW: 80.0000 mg | CHEWABLE_TABLET | ORAL | Status: DC | PRN

## 2018-09-02 MED ORDER — OXYTOCIN 10 UNIT/ML IJ SOLN
INTRAVENOUS | Status: DC | PRN
  Administered 2018-09-02: 40 [IU] via INTRAVENOUS

## 2018-09-02 SURGICAL SUPPLY — 40 items
APL SKNCLS STERI-STRIP NONHPOA (GAUZE/BANDAGES/DRESSINGS) ×1
BENZOIN TINCTURE PRP APPL 2/3 (GAUZE/BANDAGES/DRESSINGS) ×3 IMPLANT
CHLORAPREP W/TINT 26ML (MISCELLANEOUS) ×3 IMPLANT
CLAMP CORD UMBIL (MISCELLANEOUS) IMPLANT
CLOSURE STERI STRIP 1/2 X4 (GAUZE/BANDAGES/DRESSINGS) ×2 IMPLANT
CLOTH BEACON ORANGE TIMEOUT ST (SAFETY) ×3 IMPLANT
DRSG OPSITE POSTOP 4X10 (GAUZE/BANDAGES/DRESSINGS) ×3 IMPLANT
ELECT REM PT RETURN 9FT ADLT (ELECTROSURGICAL) ×3
ELECTRODE REM PT RTRN 9FT ADLT (ELECTROSURGICAL) ×1 IMPLANT
EXTRACTOR VACUUM KIWI (MISCELLANEOUS) IMPLANT
GAUZE SPONGE 4X4 12PLY STRL LF (GAUZE/BANDAGES/DRESSINGS) ×4 IMPLANT
GLOVE BIOGEL PI IND STRL 6.5 (GLOVE) ×1 IMPLANT
GLOVE BIOGEL PI IND STRL 7.0 (GLOVE) ×1 IMPLANT
GLOVE BIOGEL PI INDICATOR 6.5 (GLOVE) ×2
GLOVE BIOGEL PI INDICATOR 7.0 (GLOVE) ×2
GLOVE ECLIPSE 6.0 STRL STRAW (GLOVE) ×3 IMPLANT
GOWN STRL REUS W/TWL LRG LVL3 (GOWN DISPOSABLE) ×6 IMPLANT
KIT ABG SYR 3ML LUER SLIP (SYRINGE) IMPLANT
NDL HYPO 25X5/8 SAFETYGLIDE (NEEDLE) IMPLANT
NEEDLE HYPO 25X5/8 SAFETYGLIDE (NEEDLE) IMPLANT
NS IRRIG 1000ML POUR BTL (IV SOLUTION) ×3 IMPLANT
PACK C SECTION WH (CUSTOM PROCEDURE TRAY) ×3 IMPLANT
PAD ABD 7.5X8 STRL (GAUZE/BANDAGES/DRESSINGS) ×2 IMPLANT
PAD OB MATERNITY 4.3X12.25 (PERSONAL CARE ITEMS) ×3 IMPLANT
PENCIL SMOKE EVAC W/HOLSTER (ELECTROSURGICAL) ×3 IMPLANT
RTRCTR C-SECT PINK 25CM LRG (MISCELLANEOUS) ×3 IMPLANT
STRIP CLOSURE SKIN 1/2X4 (GAUZE/BANDAGES/DRESSINGS) ×2 IMPLANT
SUT MNCRL 0 VIOLET CTX 36 (SUTURE) ×2 IMPLANT
SUT MNCRL AB 3-0 PS2 27 (SUTURE) ×3 IMPLANT
SUT MONOCRYL 0 CTX 36 (SUTURE) ×4
SUT PLAIN 0 NONE (SUTURE) IMPLANT
SUT PLAIN 2 0 (SUTURE) ×6
SUT PLAIN ABS 2-0 CT1 27XMFL (SUTURE) ×1 IMPLANT
SUT VIC AB 0 CTX 36 (SUTURE) ×6
SUT VIC AB 0 CTX36XBRD ANBCTRL (SUTURE) ×2 IMPLANT
SUT VIC AB 2-0 CT1 (SUTURE) ×2 IMPLANT
SUT VIC AB 2-0 CT1 27 (SUTURE) ×3
SUT VIC AB 2-0 CT1 TAPERPNT 27 (SUTURE) ×1 IMPLANT
TOWEL OR 17X24 6PK STRL BLUE (TOWEL DISPOSABLE) ×3 IMPLANT
TRAY FOLEY W/BAG SLVR 14FR LF (SET/KITS/TRAYS/PACK) ×3 IMPLANT

## 2018-09-02 NOTE — H&P (Signed)
28 y.o. G1P0 @ 5728w0d presents for primary cesarean section for breech presentation.  Otherwise has good fetal movement and no bleeding.  Pregnancy c/b: 1. Single umbilical artery:  Growth US at 36 weeks 5lb 7oz (29%).    Past Medical History:  Diagnosis Date  . Medical history non-contributory     Past Surgical History:  Procedure Laterality Date  . WISDOM TOOTH EXTRACTION      OB History  Gravida Para Term Preterm AB Living  1            SAB TAB Ectopic Multiple Live Births               # Outcome Date GA Lbr Len/2nd Weight Sex Delivery Anes PTL Lv  1 Current             Social History   Socioeconomic History  . Marital status: Single    Spouse name: Not on file  . Number of children: Not on file  . Years of education: Not on file  . Highest education level: Not on file  Occupational History  . Not on file  Tobacco Use  . Smoking status: Former Smoker    Packs/day: 0.50    Types: Cigarettes    Last attempt to quit: 02/21/2015    Years since quitting: 3.5  . Smokeless tobacco: Never Used  Substance and Sexual Activity  . Alcohol use: Yes    Alcohol/week: 0.0 standard drinks  . Drug use: No  . Sexual activity: Yes    Birth control/protection: Pill   Patient has no known allergies.    Prenatal Transfer Tool  Maternal Diabetes: No Genetic Screening: Declined Maternal Ultrasounds/Referrals: Abnormal:  Findings:   Other: 2 vessel umbilical cord Fetal Ultrasounds or other Referrals:  None Maternal Substance Abuse:  Yes:  Type: Smoker--quit with pregnancy Significant Maternal Medications:  None Significant Maternal Lab Results: None  ABO, Rh: --/--/O POS, O POS Performed at [facility], [address] [place], [place] [address]  [phone](01/23 0920) Antibody: NEG (01/23 0920) Rubella: Immune (08/01 0000) RPR: Nonreactive (08/01 0000)  HBsAg: Negative (08/01 0000)  HIV: Non-reactive (08/01 0000)  GBS:   Neg    Vitals:   09/02/18 0547  Resp: 20  Temp: 98.4  F (36.9 C)  SpO2: 98%     General:  NAD Abdomen:  soft, gravid Ex:  no edema FHTs:  145  Breech presentation confirmed on BSUS this morning.   A/P   28 y.o. G1P0 6728w0d presents for primary cesarean section for breech presentation.  Discussed risks of cesarean section to include, but not limited to, infection, bleeding, damage to surrounding strutcures (including bowel, bladder, tubes, ovaries, nerves, vessels, baby), need for additional procedures, risk of blood clot, need for transfusion. Consent signed Ancef 2gm on call to OR  [facility] GEFFEL Chestine SporeLARK

## 2018-09-02 NOTE — Anesthesia Postprocedure Evaluation (Signed)
Anesthesia Post Note  Patient: Kayla Stone  Procedure(s) Performed: CESAREAN SECTION (N/A )     Patient location during evaluation: Mother Baby Anesthesia Type: Spinal Level of consciousness: awake Pain management: pain level controlled Vital Signs Assessment: post-procedure vital signs reviewed and stable Respiratory status: spontaneous breathing Cardiovascular status: stable Postop Assessment: patient able to bend at knees, no headache and no backache Anesthetic complications: no    Last Vitals:  Vitals:   09/02/18 1115 09/02/18 1207  BP: 119/70 114/62  Pulse: (!) 55 (!) 58  Resp: 16 18  Temp: 36.8 C 36.7 C  SpO2: 99% 97%    Last Pain:  Vitals:   09/02/18 1207  TempSrc: Oral  PainSc: 2    Pain Goal: Patients Stated Pain Goal: 3 (09/02/18 1207)                 Edison Pace

## 2018-09-02 NOTE — Op Note (Signed)
Cesarean Section Procedure Note  Pre-operative Diagnosis: 1. Intrauterine pregnancy at [redacted]w[redacted]d  2. Breech presentation  Post-operative Diagnosis: same as above  Surgeon: Marlow Baars, MD  Assistants: Philip Aspen, DO   Procedure: Primary low transverse cesarean section   Anesthesia: Spinal anesthesia  Estimated Blood Loss: 177 mL         Drains: Foley catheter         Specimens: Placenta to L&D              Complications:  None; patient tolerated the procedure well.         Disposition: PACU - hemodynamically stable.  Findings:  Normal uterus, tubes and ovaries bilaterally.  Viable female infant in the frank breech presentation, 3265g (7lb 3.2 oz) Apgars 9, 9.    Procedure Details   After spinal  anesthesia was found to adequate, the patient was placed in the dorsal supine position with a leftward tilt, prepped and draped in the usual sterile manner. A Pfannenstiel incision was made and carried down through the subcutaneous tissue to the fascia.  The fascia was incised in the midline and the fascial incision was extended laterally with Mayo scissors. The superior aspect of the fascial incision was grasped with two Kocher clamps, tented up and the rectus muscles dissected off sharply. The rectus was then dissected off with blunt dissection and Mayo scissors inferiorly. The rectus muscles were separated in the midline. The abdominal peritoneum was identified, tented up, entered bluntly, and the incision was extended superiorly and inferiorly with good visualization of the bladder. The Alexis retractor was deployed. The vesicouterine peritoneum was identified and well below the lower uterine segment.  A scalpel was then used to make a low transverse incision on the uterus which was extended in the cephalad-caudad direction with blunt dissection. The fluid was clear. The fetal breech was identified, elevated out of the pelvis and brought to the hysterotomy.  The fetus was delivered via the  usual breech maneuvers without difficulty.  After a 60 second delay per protocol, the cord was clamped and cut and the infant was passed to the waiting neonatologist.  The placenta was then delivered spontaneously, intact and appear normal, the uterus was cleared of all clot and debris   The hysterotomy was repaired with #0 Monocryl in running locked fashion.  A second imbricating layer of #0 Monocryl was placed.  Excellent hemostasis was noted.  The Alexis retractor was removed from the abdomen. The peritoneum was examined and all vessels noted to be hemostatic. The abdominal cavity was cleared of all clot and debris.  The peritoneum was closed with 2-0 vicryl in a running fashion.  The rectus muscles were then closed with 2-0 Vicryl. The fascia and rectus muscles were inspected and were hemostatic. The fascia was closed with 0 Vicryl in a running fashion. The subcutaneous layer was irrigated and all bleeders cauterized. The subcutaneous layer was closed with interrupted plain gut. The skin was closed with 3-0 monocryl in a subcuticular fashion. The incision was dressed with benzoine, steri strips and honeycomb dressing. All sponge lap and needle counts were correct x3. Patient tolerated the procedure well and recovered in stable condition following the procedure.

## 2018-09-02 NOTE — Transfer of Care (Signed)
Immediate Anesthesia Transfer of Care Note  Patient: Kayla Stone  Procedure(s) Performed: CESAREAN SECTION (N/A )  Patient Location: PACU  Anesthesia Type:Spinal  Level of Consciousness: awake  Airway & Oxygen Therapy: Patient Spontanous Breathing  Post-op Assessment: Report given to RN  Post vital signs: Reviewed  Last Vitals:  Vitals Value Taken Time  BP    Temp    Pulse    Resp    SpO2      Last Pain:  Vitals:   09/02/18 0547  TempSrc: Oral  PainSc: 0-No pain         Complications: No apparent anesthesia complications

## 2018-09-02 NOTE — Anesthesia Postprocedure Evaluation (Signed)
Anesthesia Post Note  Patient: Kayla Stone  Procedure(s) Performed: CESAREAN SECTION (N/A )     Patient location during evaluation: PACU Anesthesia Type: Spinal Level of consciousness: awake and alert Pain management: pain level controlled Vital Signs Assessment: post-procedure vital signs reviewed and stable Respiratory status: spontaneous breathing, nonlabored ventilation and respiratory function stable Cardiovascular status: blood pressure returned to baseline and stable Postop Assessment: no apparent nausea or vomiting and spinal receding Anesthetic complications: no    Last Vitals:  Vitals:   09/02/18 0945 09/02/18 1000  BP: 118/74 128/73  Pulse: 64 (!) 53  Resp: 18 16  Temp: 36.5 C 36.6 C  SpO2: 100% 99%    Last Pain:  Vitals:   09/02/18 1000  TempSrc: Oral  PainSc: 2    Pain Goal: Patients Stated Pain Goal: 3 (09/02/18 1000)  LLE Motor Response: Purposeful movement (09/02/18 0945) LLE Sensation: Tingling (09/02/18 0945) RLE Motor Response: Purposeful movement (09/02/18 0945) RLE Sensation: Tingling (09/02/18 0945)     Epidural/Spinal Function Cutaneous sensation: Able to Wiggle Toes (09/02/18 1000), Patient able to flex knees: Yes (09/02/18 1000), Patient able to lift hips off bed: No (09/02/18 1000), Back pain beyond tenderness at insertion site: No (09/02/18 1000), Progressively worsening motor and/or sensory loss: No (09/02/18 1000), Bowel and/or bladder incontinence post epidural: No (09/02/18 1000)  Kaylyn Layer

## 2018-09-02 NOTE — Anesthesia Procedure Notes (Signed)
Spinal  Patient location during procedure: OR Start time: 09/02/2018 7:31 AM End time: 09/02/2018 7:33 AM Staffing Anesthesiologist: Kaylyn Layer, MD Performed: anesthesiologist  Preanesthetic Checklist Completed: patient identified, site marked, pre-op evaluation, timeout performed, IV checked, risks and benefits discussed and monitors and equipment checked Spinal Block Patient position: sitting Prep: ChloraPrep Patient monitoring: heart rate, cardiac monitor and continuous pulse ox Approach: midline Location: L3-4 Injection technique: single-shot Needle Needle type: Pencan  Needle gauge: 24 G Needle length: 10 cm Assessment Sensory level: T4 Additional Notes Risks, benefits, and alternative discussed. Patient gave consent to procedure. Prepped and draped in sitting position. Clear CSF obtained after one needle redirection. Positive terminal aspiration. No pain or paraesthesias with injection. Patient tolerated procedure well. Vital signs stable. Amalia Greenhouse, MD

## 2018-09-02 NOTE — Lactation Note (Addendum)
This note was copied from a baby's chart. Lactation Consultation Note  Patient Name: Boy Kathey Spicer Today's Date: 09/02/2018   g1p1 csection delivery of baby boy Arita Miss due to being breech, Mom reports she has no breastfeeding education.  Mom asked if we would give her a breast pump.Explaiend to mom we would give her manual pump at discharge but I could give it to her now.  Mom reports she would like to eat something. Reports she Can get it tomorrow.  Mom reports she feels they are breastfeeding well and denies need for lactation services at this time.  Mom in [place] in room holding baby.  Reviewed yellow feeding logs, reviewed [facility] breastfeeding services, reviewed breastfeeding resources handouts.  Urged mom to call lactation a needed  Maternal Data    Feeding Feeding Type: Breast Milk  LATCH Score Latch: Repeated attempts needed to sustain latch, nipple held in mouth throughout feeding, stimulation needed to elicit sucking reflex.  Audible Swallowing: A few with stimulation  Type of Nipple: Everted at rest and after stimulation  Comfort (Breast/Nipple): Soft / non-tender  Hold (Positioning): Assistance needed to correctly position infant at breast and maintain latch.  LATCH Score: 7  Interventions    Lactation Tools Discussed/Used     Consult Status      Aztlan Coll Michaelle Copas 09/02/2018, 7:53 PM

## 2018-09-02 NOTE — Addendum Note (Signed)
Addendum  created 09/02/18 1719 by Earmon Phoenix, CRNA   Clinical Note Signed

## 2018-09-03 LAB — CBC
HCT: 31.1 % — ABNORMAL LOW (ref 36.0–46.0)
Hemoglobin: 10.4 g/dL — ABNORMAL LOW (ref 12.0–15.0)
MCH: 32.3 pg (ref 26.0–34.0)
MCHC: 33.4 g/dL (ref 30.0–36.0)
MCV: 96.6 fL (ref 80.0–100.0)
Platelets: 202 10*3/uL (ref 150–400)
RBC: 3.22 MIL/uL — ABNORMAL LOW (ref 3.87–5.11)
RDW: 12.6 % (ref 11.5–15.5)
WBC: 14.8 10*3/uL — ABNORMAL HIGH (ref 4.0–10.5)
nRBC: 0 % (ref 0.0–0.2)

## 2018-09-03 LAB — BIRTH TISSUE RECOVERY COLLECTION (PLACENTA DONATION)

## 2018-09-03 MED ORDER — IBUPROFEN 800 MG PO TABS
800.0000 mg | ORAL_TABLET | Freq: Four times a day (QID) | ORAL | Status: DC
  Administered 2018-09-03 – 2018-09-04 (×4): 800 mg via ORAL
  Filled 2018-09-03 (×3): qty 1

## 2018-09-03 NOTE — Addendum Note (Signed)
Addendum  created 09/03/18 1548 by Angela Adam, CRNA   Charge Capture section accepted, Clinical Note Signed

## 2018-09-03 NOTE — Lactation Note (Signed)
This note was copied from a baby's chart. Lactation Consultation Note  Patient Name: Kayla Stone EHUDJ'S Date: 09/03/2018 Reason for consult: Follow-up assessment;Term P1, 39 hour female infant  with weight loss of -4%. Per mom, infant had 7 voids and 2 stools since birth. Per mom, infant is feeding more frequently tonight LC discussed infant is cluster feeding, LC reviewed hand expression and infant was given 2 ml of colostrum on spoon. LC notice mom has pseudo bilateral inverted nipples ( dimpling) both breast but infant latches well NS is not needed at this time, Mom latched infant on right breast using football hold, swallowing observed by [facility] and infant still breastfeeding for 10 minutes as LC left room. Per mom, she has medela nipple butter cream that she has been using for sore nipples, LC notice it contains Lanolin,  LC advised mom to stop usage  due to the  risk of possible yeast/ thrush. Mom was given coconut oil to use and comfort gels and Mom knows not to use them together.   Mom knows to call Nurse or LC if she has any questions, concerns or need assistance with latching infant to breast.  Maternal Data Formula Feeding for Exclusion: No  Feeding Feeding Type: Breast Fed  LATCH Score Latch: Grasps breast easily, tongue down, lips flanged, rhythmical sucking.  Audible Swallowing: Spontaneous and intermittent  Type of Nipple: Inverted  Comfort (Breast/Nipple): Soft / non-tender  Hold (Positioning): Assistance needed to correctly position infant at breast and maintain latch.  LATCH Score: 7  Interventions Interventions: Assisted with latch;Support pillows;Adjust position  Lactation Tools Discussed/Used     Consult Status Consult Status: Follow-up Date: 09/04/18 Follow-up type: In-patient    Danelle Earthly 09/03/2018, 11:30 PM

## 2018-09-03 NOTE — Anesthesia Postprocedure Evaluation (Signed)
Anesthesia Post Note  Patient: Kayla Stone  Procedure(s) Performed: CESAREAN SECTION (N/A )     Patient location during evaluation: Mother Baby Anesthesia Type: Spinal Level of consciousness: awake and alert Pain management: pain level controlled Vital Signs Assessment: post-procedure vital signs reviewed and stable Respiratory status: spontaneous breathing Cardiovascular status: stable Postop Assessment: no headache, spinal receding, patient able to bend at knees, no apparent nausea or vomiting and able to ambulate Anesthetic complications: no Comments: Pain score 2.    Last Vitals:  Vitals:   09/03/18 0500 09/03/18 1513  BP: 104/69 115/83  Pulse: (!) 57 (!) 54  Resp: 18 18  Temp: 36.9 C 36.9 C  SpO2: 99%     Last Pain:  Vitals:   09/03/18 1520  TempSrc:   PainSc: 2    Pain Goal: Patients Stated Pain Goal: 3 (09/02/18 1700)                 [name]

## 2018-09-03 NOTE — Progress Notes (Signed)
Patient is eating, ambulating, voiding.  Pain control is good. + flatus.  Appropriate lochia, no complaints.  Vitals:   09/02/18 1700 09/02/18 2100 09/03/18 0040 09/03/18 0500  BP:  115/70 114/79 104/69  Pulse:  64 (!) 52 (!) 57  Resp:  18 18 18   Temp:  98.4 F (36.9 C) 98 F (36.7 C) 98.4 F (36.9 C)  TempSrc:  Oral Oral Oral  SpO2: 98% 97% 98% 99%  Weight:      Height:        Fundus firm Abd; soft, nontender Inc: c/d/i Ext: no calf tenderness  Lab Results  Component Value Date   WBC 14.8 (H) 09/03/2018   HGB 10.4 (L) 09/03/2018   HCT 31.1 (L) 09/03/2018   MCV 96.6 09/03/2018   PLT 202 09/03/2018    --/--/O POS, O POS Performed at [facility], [address] [place], [place] [address]  (01/23 0920)  A/P Post op day #1 s/p c/s for breech circ desired, reviewed risk/complications- consent obtained  Routine care.  Expect d/c 1/26.    Philip Aspen

## 2018-09-04 MED ORDER — OXYCODONE HCL 5 MG PO TABS: 5.0000 mg | ORAL_TABLET | ORAL | 0 refills | Status: DC | PRN

## 2018-09-04 NOTE — Discharge Summary (Signed)
Obstetric Discharge Summary Reason for Admission: cesarean section and breech Prenatal Procedures: ultrasound Intrapartum Procedures: cesarean: low cervical, transverse, breech extraction Postpartum Procedures: none Complications-Operative and Postpartum: none Hemoglobin  Date Value Ref Range Status  09/03/2018 10.4 (L) 12.0 - 15.0 g/dL Final  68/07/7516 00.1 11.1 - 15.9 g/dL Final   HCT  Date Value Ref Range Status  09/03/2018 31.1 (L) 36.0 - 46.0 % Final   Hematocrit  Date Value Ref Range Status  05/07/2016 42.9 34.0 - 46.6 % Final    Physical Exam:  General: alert and cooperative Lochia: appropriate Uterine Fundus: firm Incision: healing well, no significant drainage DVT Evaluation: No evidence of DVT seen on physical exam.  Discharge Diagnoses: Term Pregnancy-delivered  Discharge Information: Date: 09/04/2018 Activity: pelvic rest Diet: routine Medications: PNV, Ibuprofen and Percocet Condition: stable Instructions: refer to practice specific booklet Discharge to: home Follow-up Information    Marlow Baars, MD Follow up in 4 week(s).   Specialty:  Obstetrics Contact information: [address] [place] [address] [phone]           Newborn Data: Live born female  Birth Weight: 7 lb 3.2 oz (3265 g) APGAR: 9, 9  Newborn Delivery   Birth date/time:  09/02/2018 07:59:00 Delivery type:  C-Section, Low Transverse Trial of labor:  No C-section categorization:  Primary     Home with mother.  Philip Aspen 09/04/2018, 8:52 AM

## 2018-09-05 ENCOUNTER — Ambulatory Visit: Payer: Self-pay

## 2018-09-05 NOTE — Lactation Note (Signed)
This note was copied from a baby's chart. Lactation Consultation Note  Patient Name: Kayla Stone Date: 09/05/2018 Reason for consult: Follow-up assessment;Primapara;1st time breastfeeding;Term  P1 mother whose infant is now 4 hours old.    Baby was sleeping on mother's chest when I arrived.  Mother had no questions/concerns related to breast feeding.  She fed formula twice to "get herself a break" from breast feeding and to help hydrate baby.  I encouraged her to continue to feed 8-12 times/24 hours or sooner if baby shows feeding cues and to always breast feed before supplementing with the bottle.  Reminded her to try to feed from the breast more and the bottle less due to possible nipple confusion and to help increase milk supply.  Mother feels like her breasts are heavier today.    Engorgement prevention/treatment discussed.  Mother has a manual pump and a DEBP for home use.  She has our OP phone number for questions after discharge.     Maternal Data Formula Feeding for Exclusion: No Has patient been taught Hand Expression?: Yes Does the patient have breastfeeding experience prior to this delivery?: No  Feeding Feeding Type: Formula  LATCH Score                   Interventions    Lactation Tools Discussed/Used     Consult Status Consult Status: Complete Date: 09/05/18 Follow-up type: Call as needed    Citlalli Weikel R Natale Barba 09/05/2018, 8:48 AM

## 2018-09-27 DIAGNOSIS — Z1331 Encounter for screening for depression: Secondary | ICD-10-CM | POA: Diagnosis not present

## 2018-09-27 DIAGNOSIS — Z124 Encounter for screening for malignant neoplasm of cervix: Secondary | ICD-10-CM | POA: Diagnosis not present

## 2019-01-31 ENCOUNTER — Encounter: Payer: Self-pay | Admitting: Nurse Practitioner

## 2019-01-31 ENCOUNTER — Ambulatory Visit (INDEPENDENT_AMBULATORY_CARE_PROVIDER_SITE_OTHER): Payer: Medicaid Other | Admitting: Nurse Practitioner

## 2019-01-31 ENCOUNTER — Other Ambulatory Visit: Payer: Self-pay

## 2019-01-31 VITALS — BP 110/70 | HR 70 | Temp 97.4°F | Ht 65.0 in | Wt 153.0 lb

## 2019-01-31 DIAGNOSIS — N3000 Acute cystitis without hematuria: Secondary | ICD-10-CM

## 2019-01-31 DIAGNOSIS — F339 Major depressive disorder, recurrent, unspecified: Secondary | ICD-10-CM

## 2019-01-31 DIAGNOSIS — R109 Unspecified abdominal pain: Secondary | ICD-10-CM | POA: Diagnosis not present

## 2019-01-31 LAB — URINALYSIS, COMPLETE
Bilirubin, UA: NEGATIVE
Glucose, UA: NEGATIVE
Ketones, UA: NEGATIVE
Nitrite, UA: NEGATIVE
Protein,UA: NEGATIVE
Specific Gravity, UA: 1.025 (ref 1.005–1.030)
Urobilinogen, Ur: 0.2 mg/dL (ref 0.2–1.0)
pH, UA: 7.5 (ref 5.0–7.5)

## 2019-01-31 LAB — MICROSCOPIC EXAMINATION: Renal Epithel, UA: NONE SEEN /hpf

## 2019-01-31 MED ORDER — CITALOPRAM HYDROBROMIDE 20 MG PO TABS: 20.0000 mg | ORAL_TABLET | Freq: Every day | ORAL | 5 refills | Status: DC

## 2019-01-31 MED ORDER — NITROFURANTOIN MONOHYD MACRO 100 MG PO CAPS: 100.0000 mg | ORAL_CAPSULE | Freq: Two times a day (BID) | ORAL | 0 refills | Status: DC

## 2019-01-31 NOTE — Patient Instructions (Signed)
Cholecystitis ° °Cholecystitis is irritation and swelling (inflammation) of the gallbladder. The gallbladder is an organ that is shaped like a pear. It is under the liver on the right side of the body. This organ stores bile. Bile helps the body break down (digest) the fats in food. °This condition can occur all of a sudden. It needs to be treated. °What are the causes? °This condition may be caused by stones or lumps that form in the gallbladder (gallstones). Gallstones can block the tube (duct) that carries bile out of your gallbladder. °Other causes are: °· Damage to the gallbladder due to less blood flow. °· Germs in the bile ducts. °· Scars or kinks in the bile ducts. °· Abnormal growths (tumors) in the liver, pancreas, or gallbladder. °What increases the risk? °You are more likely to develop this condition if: °· You have sickle cell disease. °· You take birth control pills.  °· You use estrogen. °· You have alcoholic liver disease. °· You have liver cirrhosis. °· You are being fed through a vein. °· You are very ill. °· You do not eat or drink for a long time. This is also called "fasting." °· You are overweight (obese). °· You lose weight too fast. °· You are pregnant. °· You have high levels of fat in the blood (triglycerides). °· You have irritation and swelling of the pancreas (pancreatitis). °What are the signs or symptoms? °Symptoms of this condition include: °· Pain in the belly (abdomen). Pain is often in the upper right area of the belly. °· Tenderness or bloating in the belly. °· Feeling sick to your stomach (nauseous). °· Throwing up (vomiting). °· Fever. °· Chills. °How is this diagnosed? °This condition may be diagnosed with a medical history and exam. You may also have other tests, such as: °· Imaging tests. This may include: °? Ultrasound. °? CT scan of the belly. °? Nuclear scan. This is also called a HIDA scan. This scan lets your doctor see the bile as it moves in your body. °? MRI. °· Blood  tests. These are done to check: °? Your blood count. The white blood cell count may be higher than normal. °? How well your liver works. °How is this treated? °This condition may be treated with: °· Surgery to take out your gallbladder. °· Antibiotic medicines to treat illnesses caused by germs. °· Going without food for some time. °· Giving fluids through an IV tube. °· Medicines to treat pain or throwing up. °Follow these instructions at home: °· If you had surgery, follow instructions from your doctor about how to care for yourself after you go home. °Medicines ° °· Take over-the-counter and prescription medicines only as told by your doctor. °· If you were prescribed an antibiotic medicine, take it as told by your doctor. Do not stop taking it even if you start to feel better. °General instructions °· Follow instructions from your doctor about what to eat or drink. Do not eat or drink anything that makes you sick again. °· Do not lift anything that is heavier than 10 lb (4.5 kg) until your doctor says that it is safe. °· Do not use any products that contain nicotine or tobacco, such as cigarettes and e-cigarettes. If you need help quitting, ask your doctor. °· Keep all follow-up visits as told by your doctor. This is important. °Contact a doctor if: °· You have pain and your medicine does not help. °· You have a fever. °Get help right away if: °·   Your pain moves to: °? Another part of your belly. °? Your back. °· Your symptoms do not go away. °· You have new symptoms. °Summary °· Cholecystitis is swelling and irritation of the gallbladder. °· This condition may be caused by stones or lumps that form in the gallbladder (gallstones). °· Common symptoms are pain in the belly. You may feel sick to your stomach and start throwing up. You may also have a fever and chills. °· This condition may be treated with surgery to take out the gallbladder. It may also be treated with medicines, fasting, and fluids through an IV  tube. °· Follow what you are told about eating and drinking. Do not eat things that make you sick again. °This information is not intended to replace advice given to you by your health care provider. Make sure you discuss any questions you have with your health care provider. °Document Released: 07/16/2011 Document Revised: 12/03/2017 Document Reviewed: 12/03/2017 °Elsevier Interactive Patient Education © 2019 [employer] ° °

## 2019-01-31 NOTE — Progress Notes (Signed)
Subjective:    Patient ID: Kayla Stone, female    DOB: 12/01/1990, 28 y.o.   MRN: [id number]   Chief Complaint: Flank Pain   HPI Patient comes in today c/o: - pain right flank. Has been there for 2-3 weeks. Rates pain 6/10. Comes and goes on its own. No association with eating or not eating.  Pain usually lasts several minutes at a time. - stress- is a single mom of a newborn and stays stressed- she is no longer breast feeding. GAD 7 : Generalized Anxiety Score 01/31/2019  Nervous, Anxious, on Edge 3  Control/stop worrying 3  Worry too much - different things 3  Trouble relaxing 3  Restless 3  Easily annoyed or irritable 1  Afraid - awful might happen 0  Total GAD 7 Score 16  Anxiety Difficulty Very difficult    [facility] 01/31/2019 06/29/2017 11/09/2016  Decreased Interest 0 0 0  Down, Depressed, Hopeless 3 0 0  PHQ - 2 Score 3 0 0  Altered sleeping 0 - -  Tired, decreased energy 2 - -  Change in appetite 2 - -  Feeling bad or failure about yourself  2 - -  Trouble concentrating 2 - -  Moving slowly or fidgety/restless 0 - -  Suicidal thoughts 0 - -  PHQ-9 Score 11 - -  Difficult doing work/chores Somewhat difficult - -      Review of Systems  Constitutional: Negative.   Respiratory: Negative.   Cardiovascular: Negative.   Gastrointestinal: Positive for abdominal pain (right flank pain).  All other systems reviewed and are negative.      Objective:   Physical Exam Vitals signs and nursing note reviewed.  Constitutional:      Appearance: Normal appearance.  Cardiovascular:     Rate and Rhythm: Normal rate and regular rhythm.     Heart sounds: Normal heart sounds.  Pulmonary:     Effort: Pulmonary effort is normal.     Breath sounds: Normal breath sounds.  Abdominal:     General: Abdomen is flat. Bowel sounds are normal. There is no distension.     Palpations: Abdomen is soft.     Tenderness: There is no abdominal tenderness. There is no  right CVA tenderness or left CVA tenderness.  Skin:    General: Skin is warm and dry.  Neurological:     General: No focal deficit present.     Mental Status: She is alert and oriented to person, place, and time.  Psychiatric:        Mood and Affect: Mood normal.        Behavior: Behavior normal.    BP 110/70   Pulse 70   Temp (!) 97.4 F (36.3 C) (Oral)   Ht 5\' 5"  (1.651 m)   Wt 153 lb (69.4 kg)   BMI 25.46 kg/m   UA  Leuks 3+ blood 2+     Assessment & Plan:  Kayla Stone in today with chief complaint of Flank Pain   1. Right flank pain Keep diary of pain and what have eaten prior to- possible gallbladdder problem - Urinalysis, Complete  2. Depression, recurrent (HCC) Stress management - citalopram (CELEXA) 20 MG tablet; Take 1 tablet (20 mg total) by mouth daily.  Dispense: 30 tablet; Refill: 5  3. Acute cystitis without hematuria Take medication as prescribe Cotton underwear Take shower not bath Cranberry juice, yogurt Force fluids AZO over the counter X2 days Culture pending RTO  prn  - nitrofurantoin, macrocrystal-monohydrate, (MACROBID) 100 MG capsule; Take 1 capsule (100 mg total) by mouth 2 (two) times daily. 1 po BId  Dispense: 14 capsule; Refill: 0  Mary-Margaret Hassell Done, FNP

## 2019-02-02 LAB — URINE CULTURE

## 2019-07-19 ENCOUNTER — Telehealth: Payer: Self-pay | Admitting: Nurse Practitioner

## 2019-07-19 NOTE — Telephone Encounter (Signed)
She has appt with [facility]MM 07/24/19 for televisit. Does pt need to come in prior for labs.

## 2019-07-20 NOTE — Telephone Encounter (Signed)
No can wait and get labs done  when comes in for appointment

## 2019-07-20 NOTE — Telephone Encounter (Signed)
Patient aware and verbalized understanding. °

## 2019-07-24 ENCOUNTER — Ambulatory Visit (INDEPENDENT_AMBULATORY_CARE_PROVIDER_SITE_OTHER): Payer: Medicaid Other | Admitting: Nurse Practitioner

## 2019-07-24 ENCOUNTER — Encounter: Payer: Self-pay | Admitting: Nurse Practitioner

## 2019-07-24 DIAGNOSIS — R1011 Right upper quadrant pain: Secondary | ICD-10-CM

## 2019-07-24 NOTE — Progress Notes (Signed)
   Virtual Visit via telephone Note Due to COVID-19 pandemic this visit was conducted virtually. This visit type was conducted due to national recommendations for restrictions regarding the COVID-19 Pandemic (e.g. social distancing, sheltering in place) in an effort to limit this patient's exposure and mitigate transmission in our community. All issues noted in this document were discussed and addressed.  A physical exam was not performed with this format.  I connected with Kayla Stone on 07/24/19 at 9:45 by telephone and verified that I am speaking with the correct person using two identifiers. Kayla Stone is currently located at home and no one is currently with her during visit. The provider, Mary-Margaret Hassell Done, FNP is located in their office at time of visit.  I discussed the limitations, risks, security and privacy concerns of performing an evaluation and management service by telephone and the availability of in person appointments. I also discussed with the patient that there may be a patient responsible charge related to this service. The patient expressed understanding and agreed to proceed.   History and Present Illness:   Chief Complaint: Abdominal Pain   HPI Patient is c/o RUQ pain that comes and go. Yesterday morning the pain was severe. Sh ehad eaten chicken wings Saturday night. She says this started after she had her babby about 10 months ago.    Review of Systems  Constitutional: Negative for diaphoresis and weight loss.  Eyes: Negative for blurred vision, double vision and pain.  Respiratory: Negative for shortness of breath.   Cardiovascular: Negative for chest pain, palpitations, orthopnea and leg swelling.  Gastrointestinal: Positive for abdominal pain (RUQ). Negative for constipation, diarrhea, nausea and vomiting.  Skin: Negative for rash.  Neurological: Negative for dizziness, sensory change, loss of consciousness, weakness and headaches.    Endo/Heme/Allergies: Negative for polydipsia. Does not bruise/bleed easily.  Psychiatric/Behavioral: Negative for memory loss. The patient does not have insomnia.   All other systems reviewed and are negative.    Observations/Objective: Alert and oriented- answers all questions appropriately No distress Self reported mild pain RUQ in palpation  Assessment and Plan: Kayla Stone in today with chief complaint of Abdominal Pain   1. RUQ pain Avoid spicy and fatty foods Eat bland diet Will call once test results are in - US Abdomen Limited RUQ; Future   Follow Up Instructions: prn    I discussed the assessment and treatment plan with the patient. The patient was provided an opportunity to ask questions and all were answered. The patient agreed with the plan and demonstrated an understanding of the instructions.   The patient was advised to call back or seek an in-person evaluation if the symptoms worsen or if the condition fails to improve as anticipated.  The above assessment and management plan was discussed with the patient. The patient verbalized understanding of and has agreed to the management plan. Patient is aware to call the clinic if symptoms persist or worsen. Patient is aware when to return to the clinic for a follow-up visit. Patient educated on when it is appropriate to go to the emergency department.   Time call ended:  9:56  I provided 11 minutes of non-face-to-face time during this encounter.    Mary-Margaret Hassell Done, FNP

## 2019-08-02 ENCOUNTER — Ambulatory Visit
Admission: RE | Admit: 2019-08-02 | Discharge: 2019-08-02 | Disposition: A | Discharge status: Home / Self Care | Payer: No Typology Code available for payment source | Source: Ambulatory Visit | Attending: Nurse Practitioner | Admitting: Nurse Practitioner

## 2019-08-02 DIAGNOSIS — R1011 Right upper quadrant pain: Secondary | ICD-10-CM

## 2019-08-02 DIAGNOSIS — R1010 Upper abdominal pain, unspecified: Secondary | ICD-10-CM | POA: Diagnosis not present

## 2019-08-09 ENCOUNTER — Other Ambulatory Visit: Payer: Self-pay

## 2019-08-09 ENCOUNTER — Other Ambulatory Visit: Payer: Medicaid Other

## 2019-08-09 DIAGNOSIS — R1011 Right upper quadrant pain: Secondary | ICD-10-CM

## 2019-08-09 NOTE — Addendum Note (Signed)
Addended by: Chevis Pretty on: 08/09/2019 01:18 PM   Modules accepted: Orders

## 2019-08-14 LAB — H PYLORI, IGM, IGG, IGA AB
H pylori, IgM Abs: <9 units (ref 0.0–8.9)
H. pylori, IgA Abs: <9 units (ref 0.0–8.9)
H. pylori, IgG AbS: 0.13 Index Value (ref 0.00–0.79)

## 2019-12-29 ENCOUNTER — Ambulatory Visit (INDEPENDENT_AMBULATORY_CARE_PROVIDER_SITE_OTHER): Payer: PRIVATE HEALTH INSURANCE | Admitting: Family Medicine

## 2019-12-29 ENCOUNTER — Encounter: Payer: Self-pay | Admitting: Family Medicine

## 2019-12-29 DIAGNOSIS — J0100 Acute maxillary sinusitis, unspecified: Secondary | ICD-10-CM | POA: Diagnosis not present

## 2019-12-29 MED ORDER — AZITHROMYCIN 250 MG PO TABS: ORAL_TABLET | ORAL | 0 refills | Status: DC

## 2019-12-29 MED ORDER — FLUTICASONE PROPIONATE 50 MCG/ACT NA SUSP: 1.0000 | Freq: Two times a day (BID) | NASAL | 6 refills | Status: DC | PRN

## 2019-12-29 NOTE — Progress Notes (Signed)
   Virtual Visit via telephone Note  I connected with Kayla Stone on 12/29/19 at 1642 by telephone and verified that I am speaking with the correct person using two identifiers. Kayla Stone is currently located at home and no other people are currently with her during visit. The provider, Elige Radon Azura Tufaro, MD is located in their office at time of visit.  Call ended at 1650  I discussed the limitations, risks, security and privacy concerns of performing an evaluation and management service by telephone and the availability of in person appointments. I also discussed with the patient that there may be a patient responsible charge related to this service. The patient expressed understanding and agreed to proceed.   History and Present Illness: Patient is feeling a lot of pressure in head and drainage and cough.  She feels like it is green and ears stopped up.  Denies fever or chills or body aches.  She has had this 3 days ago and she took alka seltzer cold and it is not helping.  She denies any sick contacts.    No diagnosis found.    Review of Systems  Constitutional: Negative for chills and fever.  HENT: Positive for congestion, postnasal drip, rhinorrhea, sinus pressure, sneezing and sore throat. Negative for ear discharge and ear pain.   Eyes: Negative for pain, redness and visual disturbance.  Respiratory: Positive for cough. Negative for chest tightness and shortness of breath.   Cardiovascular: Negative for chest pain and leg swelling.  Genitourinary: Negative for difficulty urinating and dysuria.  Musculoskeletal: Negative for back pain and gait problem.  Skin: Negative for rash.  Neurological: Negative for light-headedness and headaches.  Psychiatric/Behavioral: Negative for agitation and behavioral problems.  All other systems reviewed and are negative.   Observations/Objective: Patient sounds comfortable and in no acute distress  Assessment and Plan: Problem List  Items Addressed This Visit    None    Visit Diagnoses    Acute non-recurrent maxillary sinusitis    -  Primary   Relevant Medications   fluticasone (FLONASE) 50 MCG/ACT nasal spray   azithromycin (ZITHROMAX) 250 MG tablet       Follow up plan: Return if symptoms worsen or fail to improve.     I discussed the assessment and treatment plan with the patient. The patient was provided an opportunity to ask questions and all were answered. The patient agreed with the plan and demonstrated an understanding of the instructions.   The patient was advised to call back or seek an in-person evaluation if the symptoms worsen or if the condition fails to improve as anticipated.  The above assessment and management plan was discussed with the patient. The patient verbalized understanding of and has agreed to the management plan. Patient is aware to call the clinic if symptoms persist or worsen. Patient is aware when to return to the clinic for a follow-up visit. Patient educated on when it is appropriate to go to the emergency department.    I provided 8 minutes of non-face-to-face time during this encounter.    Nils Pyle, MD

## 2020-04-22 ENCOUNTER — Other Ambulatory Visit: Payer: Self-pay

## 2020-04-22 ENCOUNTER — Ambulatory Visit (INDEPENDENT_AMBULATORY_CARE_PROVIDER_SITE_OTHER): Payer: Medicaid Other | Admitting: Nurse Practitioner

## 2020-04-22 ENCOUNTER — Encounter: Payer: Self-pay | Admitting: Nurse Practitioner

## 2020-04-22 VITALS — BP 118/77 | HR 70 | Temp 98.2°F | Resp 20 | Ht 65.0 in | Wt 149.0 lb

## 2020-04-22 DIAGNOSIS — F411 Generalized anxiety disorder: Secondary | ICD-10-CM

## 2020-04-22 MED ORDER — CITALOPRAM HYDROBROMIDE 20 MG PO TABS: 20.0000 mg | ORAL_TABLET | Freq: Every day | ORAL | 5 refills | Status: DC

## 2020-04-22 NOTE — Progress Notes (Signed)
Subjective:    Patient ID: Kayla Stone, female    DOB: 08/19/1990, 29 y.o.   MRN: [id number]   Chief Complaint: Anxiety   HPI Patient come sin c/o anxiety. She says she stays stressed all the time and she gets angry easily. She denies any depression. Depression screen [facility] 2/9 04/22/2020 01/31/2019 06/29/2017  Decreased Interest 0 0 0  Down, Depressed, Hopeless 0 3 0  PHQ - 2 Score 0 3 0  Altered sleeping 0 0 -  Tired, decreased energy 0 2 -  Change in appetite 1 2 -  Feeling bad or failure about yourself  0 2 -  Trouble concentrating 3 2 -  Moving slowly or fidgety/restless 0 0 -  Suicidal thoughts 0 0 -  PHQ-9 Score 4 11 -  Difficult doing work/chores Not difficult at all Somewhat difficult -   GAD 7 : Generalized Anxiety Score 04/22/2020 01/31/2019  Nervous, Anxious, on Edge 2 3  Control/stop worrying 2 3  Worry too much - different things 3 3  Trouble relaxing 2 3  Restless 2 3  Easily annoyed or irritable 3 1  Afraid - awful might happen 3 0  Total GAD 7 Score 17 16  Anxiety Difficulty Not difficult at all Very difficult       Review of Systems  Constitutional: Negative for diaphoresis.  Eyes: Negative for pain.  Respiratory: Negative for shortness of breath.   Cardiovascular: Negative for chest pain, palpitations and leg swelling.  Gastrointestinal: Negative for abdominal pain.  Endocrine: Negative for polydipsia.  Skin: Negative for rash.  Neurological: Negative for dizziness, weakness and headaches.  Hematological: Does not bruise/bleed easily.  All other systems reviewed and are negative.      Objective:   Physical Exam Vitals and nursing note reviewed.  Constitutional:      General: She is not in acute distress.    Appearance: Normal appearance. She is well-developed.  HENT:     Head: Normocephalic.     Nose: Nose normal.  Eyes:     Pupils: Pupils are equal, round, and reactive to light.  Neck:     Vascular: No carotid bruit or JVD.    Cardiovascular:     Rate and Rhythm: Normal rate and regular rhythm.     Heart sounds: Normal heart sounds.  Pulmonary:     Effort: Pulmonary effort is normal. No respiratory distress.     Breath sounds: Normal breath sounds. No wheezing or rales.  Chest:     Chest wall: No tenderness.  Abdominal:     General: Bowel sounds are normal. There is no distension or abdominal bruit.     Palpations: Abdomen is soft. There is no hepatomegaly, splenomegaly, mass or pulsatile mass.     Tenderness: There is no abdominal tenderness.  Musculoskeletal:        General: Normal range of motion.     Cervical back: Normal range of motion and neck supple.  Lymphadenopathy:     Cervical: No cervical adenopathy.  Skin:    General: Skin is warm and dry.  Neurological:     Mental Status: She is alert and oriented to person, place, and time.     Deep Tendon Reflexes: Reflexes are normal and symmetric.  Psychiatric:        Behavior: Behavior normal.        Thought Content: Thought content normal.        Judgment: Judgment normal.    BP 118/77  Pulse 70   Temp 98.2 F (36.8 C) (Temporal)   Resp 20   Ht 5\' 5"  (1.651 m)   Wt 149 lb (67.6 kg)   SpO2 97%   BMI 24.79 kg/m         Assessment & Plan:  in today with chief complaint of Anxiety   1. GAD (generalized anxiety disorder) Stress management Discussed side effects Follow up in 1 month - citalopram (CELEXA) 20 MG tablet; Take 1 tablet (20 mg total) by mouth daily.  Dispense: 30 tablet; Refill: 5    The above assessment and management plan was discussed with the patient. The patient verbalized understanding of and has agreed to the management plan. Patient is aware to call the clinic if symptoms persist or worsen. Patient is aware when to return to the clinic for a follow-up visit. Patient educated on when it is appropriate to go to the emergency department.   Mary-Margaret Kayla Safe, FNP

## 2020-04-22 NOTE — Patient Instructions (Signed)
Stress, Adult Stress is a normal reaction to life events. Stress is what you feel when life demands more than you are used to, or more than you think you can handle. Some stress can be useful, such as studying for a test or meeting a deadline at work. Stress that occurs too often or for too long can cause problems. It can affect your emotional health and interfere with relationships and normal daily activities. Too much stress can weaken your body's defense system (immune system) and increase your risk for physical illness. If you already have a medical problem, stress can make it worse. What are the causes? All sorts of life events can cause stress. An event that causes stress for one person may not be stressful for another person. Major life events, whether positive or negative, commonly cause stress. Examples include:  Losing a job or starting a new job.  Losing a loved one.  Moving to a new town or home.  Getting married or divorced.  Having a baby.  Getting injured or sick. Less obvious life events can also cause stress, especially if they occur day after day or in combination with each other. Examples include:  Working long hours.  Driving in traffic.  Caring for children.  Being in debt.  Being in a difficult relationship. What are the signs or symptoms? Stress can cause emotional symptoms, including:  Anxiety. This is feeling worried, afraid, on edge, overwhelmed, or out of control.  Anger, including irritation or impatience.  Depression. This is feeling sad, down, helpless, or guilty.  Trouble focusing, remembering, or making decisions. Stress can cause physical symptoms, including:  Aches and pains. These may affect your head, neck, back, stomach, or other areas of your body.  Tight muscles or a clenched jaw.  Low energy.  Trouble sleeping. Stress can cause unhealthy behaviors, including:  Eating to feel better (overeating) or skipping meals.  Working too  much or putting off tasks.  Smoking, drinking alcohol, or using drugs to feel better. How is this diagnosed? Stress is diagnosed through an assessment by your health care provider. He or she may diagnose this condition based on:  Your symptoms and any stressful life events.  Your medical history.  Tests to rule out other causes of your symptoms. Depending on your condition, your health care provider may refer you to a specialist for further evaluation. How is this treated?  Stress management techniques are the recommended treatment for stress. Medicine is not typically recommended for the treatment of stress. Techniques to reduce your reaction to stressful life events include:  Stress identification. Monitor yourself for symptoms of stress and identify what causes stress for you. These skills may help you to avoid or prepare for stressful events.  Time management. Set your priorities, keep a calendar of events, and learn to say no. Taking these actions can help you avoid making too many commitments. Techniques for coping with stress include:  Rethinking the problem. Try to think realistically about stressful events rather than ignoring them or overreacting. Try to find the positives in a stressful situation rather than focusing on the negatives.  Exercise. Physical exercise can release both physical and emotional tension. The key is to find a form of exercise that you enjoy and do it regularly.  Relaxation techniques. These relax the body and mind. The key is to find one or more that you enjoy and use the techniques regularly. Examples include: ? Meditation, deep breathing, or progressive relaxation techniques. ? Yoga or   tai chi. ? Biofeedback, mindfulness techniques, or journaling. ? Listening to music, being out in nature, or participating in other hobbies.  Practicing a healthy lifestyle. Eat a balanced diet, drink plenty of water, limit or avoid caffeine, and get plenty of  sleep.  Having a strong support network. Spend time with family, friends, or other people you enjoy being around. Express your feelings and talk things over with someone you trust. Counseling or talk therapy with a mental health professional may be helpful if you are having trouble managing stress on your own. Follow these instructions at home: Lifestyle   Avoid drugs.  Do not use any products that contain nicotine or tobacco, such as cigarettes, e-cigarettes, and chewing tobacco. If you need help quitting, ask your health care provider.  Limit alcohol intake to no more than 1 drink a day for nonpregnant women and 2 drinks a day for men. One drink equals 12 oz of beer, 5 oz of wine, or 1 oz of hard liquor  Do not use alcohol or drugs to relax.  Eat a balanced diet that includes fresh fruits and vegetables, whole grains, lean meats, fish, eggs, and beans, and low-fat dairy. Avoid processed foods and foods high in added fat, sugar, and salt.  Exercise at least 30 minutes on 5 or more days each week.  Get 7-8 hours of sleep each night. General instructions   Practice stress management techniques as discussed with your health care provider.  Drink enough fluid to keep your urine clear or pale yellow.  Take over-the-counter and prescription medicines only as told by your health care provider.  Keep all follow-up visits as told by your health care provider. This is important. Contact a health care provider if:  Your symptoms get worse.  You have new symptoms.  You feel overwhelmed by your problems and can no longer manage them on your own. Get help right away if:  You have thoughts of hurting yourself or others. If you ever feel like you may hurt yourself or others, or have thoughts about taking your own life, get help right away. You can go to your nearest emergency department or call:  Your local emergency services (911 in the [place]  A suicide crisis helpline, such as the  [place] at (316) 250-6172. This is open 24 hours a day. Summary  Stress is a normal reaction to life events. It can cause problems if it happens too often or for too long.  Practicing stress management techniques is the best way to treat stress.  Counseling or talk therapy with a mental health professional may be helpful if you are having trouble managing stress on your own. This information is not intended to replace advice given to you by your health care provider. Make sure you discuss any questions you have with your health care provider. Document Revised: 02/24/2019 Document Reviewed: 09/16/2016 Elsevier Patient Education  [place].

## 2020-05-03 ENCOUNTER — Ambulatory Visit: Payer: PRIVATE HEALTH INSURANCE | Admitting: Nurse Practitioner

## 2020-05-07 ENCOUNTER — Telehealth: Payer: Self-pay | Admitting: Nurse Practitioner

## 2020-05-07 DIAGNOSIS — J029 Acute pharyngitis, unspecified: Secondary | ICD-10-CM | POA: Diagnosis not present

## 2020-05-07 NOTE — Telephone Encounter (Signed)
Patient complains of sore throat.  Recently seen in urgent care and given antibiotic and prednisone.  Patient has finished both with no relief.  Appt made to be evaluated tomorrow morning

## 2020-05-08 ENCOUNTER — Ambulatory Visit: Payer: Medicaid Other | Admitting: Family Medicine

## 2020-05-27 ENCOUNTER — Other Ambulatory Visit: Payer: Self-pay

## 2020-05-27 ENCOUNTER — Ambulatory Visit: Payer: BLUE CROSS/BLUE SHIELD | Admitting: Nurse Practitioner

## 2020-05-27 ENCOUNTER — Encounter: Payer: Self-pay | Admitting: Nurse Practitioner

## 2020-05-27 VITALS — BP 117/76 | HR 69 | Temp 98.2°F | Resp 20 | Ht 65.0 in | Wt 152.0 lb

## 2020-05-27 DIAGNOSIS — F411 Generalized anxiety disorder: Secondary | ICD-10-CM | POA: Diagnosis not present

## 2020-05-27 NOTE — Patient Instructions (Signed)
Stress, Adult Stress is a normal reaction to life events. Stress is what you feel when life demands more than you are used to, or more than you think you can handle. Some stress can be useful, such as studying for a test or meeting a deadline at work. Stress that occurs too often or for too long can cause problems. It can affect your emotional health and interfere with relationships and normal daily activities. Too much stress can weaken your body's defense system (immune system) and increase your risk for physical illness. If you already have a medical problem, stress can make it worse. What are the causes? All sorts of life events can cause stress. An event that causes stress for one person may not be stressful for another person. Major life events, whether positive or negative, commonly cause stress. Examples include:  Losing a job or starting a new job.  Losing a loved one.  Moving to a new town or home.  Getting married or divorced.  Having a baby.  Getting injured or sick. Less obvious life events can also cause stress, especially if they occur day after day or in combination with each other. Examples include:  Working long hours.  Driving in traffic.  Caring for children.  Being in debt.  Being in a difficult relationship. What are the signs or symptoms? Stress can cause emotional symptoms, including:  Anxiety. This is feeling worried, afraid, on edge, overwhelmed, or out of control.  Anger, including irritation or impatience.  Depression. This is feeling sad, down, helpless, or guilty.  Trouble focusing, remembering, or making decisions. Stress can cause physical symptoms, including:  Aches and pains. These may affect your head, neck, back, stomach, or other areas of your body.  Tight muscles or a clenched jaw.  Low energy.  Trouble sleeping. Stress can cause unhealthy behaviors, including:  Eating to feel better (overeating) or skipping meals.  Working too  much or putting off tasks.  Smoking, drinking alcohol, or using drugs to feel better. How is this diagnosed? Stress is diagnosed through an assessment by your health care provider. He or she may diagnose this condition based on:  Your symptoms and any stressful life events.  Your medical history.  Tests to rule out other causes of your symptoms. Depending on your condition, your health care provider may refer you to a specialist for further evaluation. How is this treated?  Stress management techniques are the recommended treatment for stress. Medicine is not typically recommended for the treatment of stress. Techniques to reduce your reaction to stressful life events include:  Stress identification. Monitor yourself for symptoms of stress and identify what causes stress for you. These skills may help you to avoid or prepare for stressful events.  Time management. Set your priorities, keep a calendar of events, and learn to say no. Taking these actions can help you avoid making too many commitments. Techniques for coping with stress include:  Rethinking the problem. Try to think realistically about stressful events rather than ignoring them or overreacting. Try to find the positives in a stressful situation rather than focusing on the negatives.  Exercise. Physical exercise can release both physical and emotional tension. The key is to find a form of exercise that you enjoy and do it regularly.  Relaxation techniques. These relax the body and mind. The key is to find one or more that you enjoy and use the techniques regularly. Examples include: ? Meditation, deep breathing, or progressive relaxation techniques. ? Yoga or   tai chi. ? Biofeedback, mindfulness techniques, or journaling. ? Listening to music, being out in nature, or participating in other hobbies.  Practicing a healthy lifestyle. Eat a balanced diet, drink plenty of water, limit or avoid caffeine, and get plenty of  sleep.  Having a strong support network. Spend time with family, friends, or other people you enjoy being around. Express your feelings and talk things over with someone you trust. Counseling or talk therapy with a mental health professional may be helpful if you are having trouble managing stress on your own. Follow these instructions at home: Lifestyle   Avoid drugs.  Do not use any products that contain nicotine or tobacco, such as cigarettes, e-cigarettes, and chewing tobacco. If you need help quitting, ask your health care provider.  Limit alcohol intake to no more than 1 drink a day for nonpregnant women and 2 drinks a day for men. One drink equals 12 oz of beer, 5 oz of wine, or 1 oz of hard liquor  Do not use alcohol or drugs to relax.  Eat a balanced diet that includes fresh fruits and vegetables, whole grains, lean meats, fish, eggs, and beans, and low-fat dairy. Avoid processed foods and foods high in added fat, sugar, and salt.  Exercise at least 30 minutes on 5 or more days each week.  Get 7-8 hours of sleep each night. General instructions   Practice stress management techniques as discussed with your health care provider.  Drink enough fluid to keep your urine clear or pale yellow.  Take over-the-counter and prescription medicines only as told by your health care provider.  Keep all follow-up visits as told by your health care provider. This is important. Contact a health care provider if:  Your symptoms get worse.  You have new symptoms.  You feel overwhelmed by your problems and can no longer manage them on your own. Get help right away if:  You have thoughts of hurting yourself or others. If you ever feel like you may hurt yourself or others, or have thoughts about taking your own life, get help right away. You can go to your nearest emergency department or call:  Your local emergency services (911 in the [place]  A suicide crisis helpline, such as the  [place] at (316) 250-6172. This is open 24 hours a day. Summary  Stress is a normal reaction to life events. It can cause problems if it happens too often or for too long.  Practicing stress management techniques is the best way to treat stress.  Counseling or talk therapy with a mental health professional may be helpful if you are having trouble managing stress on your own. This information is not intended to replace advice given to you by your health care provider. Make sure you discuss any questions you have with your health care provider. Document Revised: 02/24/2019 Document Reviewed: 09/16/2016 Elsevier Patient Education  [place].

## 2020-05-27 NOTE — Progress Notes (Signed)
   Subjective:    Patient ID: Kayla Stone, female    DOB: [birth year], 29 y.o.   MRN: [id number]   Chief Complaint: 1 month follow up   HPI Patient was seen on 04/22/20 with anxiety. We started her on celexa 20mg . Today she says that she is feeling much better. Feels less anxious.  GAD 7 : Generalized Anxiety Score 05/27/2020 04/22/2020 01/31/2019  Nervous, Anxious, on Edge 0 2 3  Control/stop worrying 0 2 3  Worry too much - different things 0 3 3  Trouble relaxing 0 2 3  Restless 0 2 3  Easily annoyed or irritable 0 3 1  Afraid - awful might happen 0 3 0  Total GAD 7 Score 0 17 16  Anxiety Difficulty Not difficult at all Not difficult at all Very difficult   Depression screen [facility] 2/9 05/27/2020 04/22/2020 01/31/2019 06/29/2017 11/09/2016  Decreased Interest 0 0 0 0 0  Down, Depressed, Hopeless 0 0 3 0 0  PHQ - 2 Score 0 0 3 0 0  Altered sleeping 0 0 0 - -  Tired, decreased energy 0 0 2 - -  Change in appetite 0 1 2 - -  Feeling bad or failure about yourself  0 0 2 - -  Trouble concentrating 0 3 2 - -  Moving slowly or fidgety/restless 0 0 0 - -  Suicidal thoughts 0 0 0 - -  PHQ-9 Score 0 4 11 - -  Difficult doing work/chores Not difficult at all Not difficult at all Somewhat difficult - -       Review of Systems  Constitutional: Negative for fatigue.  Respiratory: Negative.   Cardiovascular: Negative.   Gastrointestinal: Negative.   All other systems reviewed and are negative.      Objective:   Physical Exam Vitals and nursing note reviewed.  Constitutional:      Appearance: Normal appearance.  Cardiovascular:     Rate and Rhythm: Normal rate and regular rhythm.     Heart sounds: Normal heart sounds.  Pulmonary:     Breath sounds: Normal breath sounds.  Skin:    General: Skin is warm.  Neurological:     General: No focal deficit present.     Mental Status: She is alert and oriented to person, place, and time.  Psychiatric:        Mood and Affect: Mood  normal.        Behavior: Behavior normal.    BP 117/76   Pulse 69   Temp 98.2 F (36.8 C) (Temporal)   Resp 20   Ht 5\' 5"  (1.651 m)   Wt 152 lb (68.9 kg)   SpO2 99%   BMI 25.29 kg/m        Assessment & Plan:  01/09/2017 in today with chief complaint of 1 month follow up   1. GAD (generalized anxiety disorder) Continue celexa 20mg  daily Stress management Follow up in 6 months    The above assessment and management plan was discussed with the patient. The patient verbalized understanding of and has agreed to the management plan. Patient is aware to call the clinic if symptoms persist or worsen. Patient is aware when to return to the clinic for a follow-up visit. Patient educated on when it is appropriate to go to the emergency department.   Mary-Margaret , FNP

## 2020-06-03 ENCOUNTER — Ambulatory Visit: Payer: Self-pay | Admitting: Nurse Practitioner

## 2020-08-16 ENCOUNTER — Encounter: Payer: Self-pay | Admitting: Nurse Practitioner

## 2020-08-16 ENCOUNTER — Ambulatory Visit (INDEPENDENT_AMBULATORY_CARE_PROVIDER_SITE_OTHER): Payer: Medicaid Other | Admitting: Nurse Practitioner

## 2020-08-16 DIAGNOSIS — Z20822 Contact with and (suspected) exposure to covid-19: Secondary | ICD-10-CM

## 2020-08-16 NOTE — Addendum Note (Signed)
Addended by: Margorie John on: 08/16/2020 05:00 PM   Modules accepted: Orders

## 2020-08-16 NOTE — Progress Notes (Signed)
   Virtual Visit via telephone Note Due to COVID-19 pandemic this visit was conducted virtually. This visit type was conducted due to national recommendations for restrictions regarding the COVID-19 Pandemic (e.g. social distancing, sheltering in place) in an effort to limit this patient's exposure and mitigate transmission in our community. All issues noted in this document were discussed and addressed.  A physical exam was not performed with this format.  I connected with Kayla Stone on 08/16/20 at 1:10 by telephone and verified that I am speaking with the correct person using two identifiers. Kayla Stone is currently located at home and no one is currently with  her during visit. The provider, Mary-Margaret Daphine Deutscher, FNP is located in their office at time of visit.  I discussed the limitations, risks, security and privacy concerns of performing an evaluation and management service by telephone and the availability of in person appointments. I also discussed with the patient that there may be a patient responsible charge related to this service. The patient expressed understanding and agreed to proceed.   History and Present Illness:   Chief Complaint: Covid Exposure   HPI Was exposed to covid over the weekend. She is not symptomatic, but needs test to go back to work. She says she has no symptoms.   Review of Systems  Constitutional: Negative for chills, fever and malaise/fatigue.  HENT: Negative for congestion, ear discharge, sinus pain and sore throat.   Respiratory: Negative for cough.   Musculoskeletal: Negative for myalgias.  Neurological: Negative for dizziness and headaches.  All other systems reviewed and are negative.    Observations/Objective: Alert and oriented- answers all questions appropriately No distress    Assessment and Plan: Kayla Stone in today with chief complaint of Covid Exposure   1. Close exposure to COVID-19 virus Quarantine until test  results are back Force fluids rest - Novel Coronavirus, NAA ([facility]); Future    Follow Up Instructions: prn    I discussed the assessment and treatment plan with the patient. The patient was provided an opportunity to ask questions and all were answered. The patient agreed with the plan and demonstrated an understanding of the instructions.   The patient was advised to call back or seek an in-person evaluation if the symptoms worsen or if the condition fails to improve as anticipated.  The above assessment and management plan was discussed with the patient. The patient verbalized understanding of and has agreed to the management plan. Patient is aware to call the clinic if symptoms persist or worsen. Patient is aware when to return to the clinic for a follow-up visit. Patient educated on when it is appropriate to go to the emergency department.   Time call ended:  1:18  I provided 8 minutes of non-face-to-face time during this encounter.    Mary-Margaret Daphine Deutscher, FNP

## 2020-08-20 LAB — NOVEL CORONAVIRUS, NAA: SARS-CoV-2, NAA: NOT DETECTED

## 2020-08-23 ENCOUNTER — Other Ambulatory Visit: Payer: Medicaid Other

## 2021-02-27 ENCOUNTER — Ambulatory Visit (INDEPENDENT_AMBULATORY_CARE_PROVIDER_SITE_OTHER): Payer: BLUE CROSS/BLUE SHIELD | Admitting: Nurse Practitioner

## 2021-02-27 ENCOUNTER — Other Ambulatory Visit: Payer: Self-pay

## 2021-02-27 ENCOUNTER — Encounter: Payer: Self-pay | Admitting: Nurse Practitioner

## 2021-02-27 VITALS — BP 111/71 | HR 85 | Temp 97.8°F | Resp 20 | Ht 65.0 in | Wt 153.0 lb

## 2021-02-27 DIAGNOSIS — Z0001 Encounter for general adult medical examination with abnormal findings: Secondary | ICD-10-CM | POA: Diagnosis not present

## 2021-02-27 DIAGNOSIS — Z0000 Encounter for general adult medical examination without abnormal findings: Secondary | ICD-10-CM

## 2021-02-27 DIAGNOSIS — F411 Generalized anxiety disorder: Secondary | ICD-10-CM

## 2021-02-27 NOTE — Patient Instructions (Signed)

## 2021-02-27 NOTE — Progress Notes (Signed)
Subjective:    Patient ID: Kayla Stone, female    DOB: [birth year], 30 y.o.   MRN: [id number]   Chief Complaint: Annual Exam    HPI:  1. Annual physical exam Her last PAP was 09/27/18 and was normal  2. GAD (generalized anxiety disorder) Is on celexa daily and is doing well.    Outpatient Encounter Medications as of 02/27/2021  Medication Sig   citalopram (CELEXA) 20 MG tablet Take 1 tablet (20 mg total) by mouth daily.   No facility-administered encounter medications on file as of 02/27/2021.    Past Surgical History:  Procedure Laterality Date   CESAREAN SECTION N/A 09/02/2018   Procedure: CESAREAN SECTION;  Surgeon: Jerelyn Charles, MD;  Location: [place];  Service: Obstetrics;  Laterality: N/A;   WISDOM TOOTH EXTRACTION      History reviewed. No pertinent family history.  New complaints: None today  Social history: Lives with her son. Is a single mom  Controlled substance contract: n/a     Review of Systems  Constitutional:  Negative for diaphoresis.  Eyes:  Negative for pain.  Respiratory:  Negative for shortness of breath.   Cardiovascular:  Negative for chest pain, palpitations and leg swelling.  Gastrointestinal:  Negative for abdominal pain.  Endocrine: Negative for polydipsia.  Skin:  Negative for rash.  Neurological:  Negative for dizziness, weakness and headaches.  Hematological:  Does not bruise/bleed easily.  All other systems reviewed and are negative.     Objective:   Physical Exam Vitals and nursing note reviewed.  Constitutional:      General: She is not in acute distress.    Appearance: Normal appearance. She is well-developed.  HENT:     Head: Normocephalic.     Right Ear: Tympanic membrane normal.     Left Ear: Tympanic membrane normal.     Nose: Nose normal.     Mouth/Throat:     Mouth: Mucous membranes are moist.  Eyes:     Pupils: Pupils are equal, round, and reactive to light.  Neck:     Vascular: No carotid  bruit or JVD.  Cardiovascular:     Rate and Rhythm: Normal rate and regular rhythm.     Heart sounds: Normal heart sounds.  Pulmonary:     Effort: Pulmonary effort is normal. No respiratory distress.     Breath sounds: Normal breath sounds. No wheezing or rales.  Chest:     Chest wall: No tenderness.  Abdominal:     General: Bowel sounds are normal. There is no distension or abdominal bruit.     Palpations: Abdomen is soft. There is no hepatomegaly, splenomegaly, mass or pulsatile mass.     Tenderness: There is no abdominal tenderness.  Musculoskeletal:        General: Normal range of motion.     Cervical back: Normal range of motion and neck supple.  Lymphadenopathy:     Cervical: No cervical adenopathy.  Skin:    General: Skin is warm and dry.  Neurological:     Mental Status: She is alert and oriented to person, place, and time.     Deep Tendon Reflexes: Reflexes are normal and symmetric.  Psychiatric:        Behavior: Behavior normal.        Thought Content: Thought content normal.        Judgment: Judgment normal.    BP 111/71   Pulse 85   Temp 97.8 F (36.6 C) (Temporal)  Resp 20   Ht _0  (1.651 m)   Wt 153 lb (69.4 kg)   SpO2 98%   BMI 25.46 kg/m        Assessment & Plan:   Kayla Stone in today with chief complaint of Annual Exam   1. Annual physical exam Labs opending - CBC with Differential/Platelet - CMP14+EGFR - Lipid panel - Thyroid Panel With TSH - VITAMIN D 25 Hydroxy (Vit-D Deficiency, Fractures)  2. GAD (generalized anxiety disorder) Stress management Can start back on celexa if needed    The above assessment and management plan was discussed with the patient. The patient verbalized understanding of and has agreed to the management plan. Patient is aware to call the clinic if symptoms persist or worsen. Patient is aware when to return to the clinic for a follow-up visit. Patient educated on when it is appropriate to go to the  emergency department.   Mary-Margaret Hassell Done, FNP

## 2021-02-28 LAB — LIPID PANEL
Chol/HDL Ratio: 3.5 ratio (ref 0.0–4.4)
Cholesterol, Total: 156 mg/dL (ref 100–199)
HDL: 44 mg/dL (ref >39)
LDL Chol Calc (NIH): 98 mg/dL (ref 0–99)
Triglycerides: 69 mg/dL (ref 0–149)
VLDL Cholesterol Cal: 14 mg/dL (ref 5–40)

## 2021-02-28 LAB — CBC WITH DIFFERENTIAL/PLATELET
Basophils Absolute: 0 10*3/uL (ref 0.0–0.2)
Basos: 1 %
EOS (ABSOLUTE): 0.2 10*3/uL (ref 0.0–0.4)
Eos: 3 %
Hematocrit: 44.3 % (ref 34.0–46.6)
Hemoglobin: 15.7 g/dL (ref 11.1–15.9)
Immature Grans (Abs): 0 10*3/uL (ref 0.0–0.1)
Immature Granulocytes: 0 %
Lymphocytes Absolute: 2.7 10*3/uL (ref 0.7–3.1)
Lymphs: 44 %
MCH: 33.3 pg — ABNORMAL HIGH (ref 26.6–33.0)
MCHC: 35.4 g/dL (ref 31.5–35.7)
MCV: 94 fL (ref 79–97)
Monocytes Absolute: 0.5 10*3/uL (ref 0.1–0.9)
Monocytes: 7 %
Neutrophils Absolute: 2.8 10*3/uL (ref 1.4–7.0)
Neutrophils: 45 %
Platelets: 278 10*3/uL (ref 150–450)
RBC: 4.71 x10E6/uL (ref 3.77–5.28)
RDW: 11.9 % (ref 11.7–15.4)
WBC: 6.2 10*3/uL (ref 3.4–10.8)

## 2021-02-28 LAB — CMP14+EGFR
ALT: 12 IU/L (ref 0–32)
AST: 16 IU/L (ref 0–40)
Albumin/Globulin Ratio: 1.7 (ref 1.2–2.2)
Albumin: 4.8 g/dL (ref 3.9–5.0)
Alkaline Phosphatase: 64 IU/L (ref 44–121)
BUN/Creatinine Ratio: 11 (ref 9–23)
BUN: 8 mg/dL (ref 6–20)
Bilirubin Total: 0.7 mg/dL (ref 0.0–1.2)
CO2: 24 mmol/L (ref 20–29)
Calcium: 9.6 mg/dL (ref 8.7–10.2)
Chloride: 101 mmol/L (ref 96–106)
Creatinine, Ser: 0.72 mg/dL (ref 0.57–1.00)
Globulin, Total: 2.8 g/dL (ref 1.5–4.5)
Glucose: 89 mg/dL (ref 65–99)
Potassium: 4.5 mmol/L (ref 3.5–5.2)
Sodium: 139 mmol/L (ref 134–144)
Total Protein: 7.6 g/dL (ref 6.0–8.5)
eGFR: 115 mL/min/{1.73_m2} (ref >59)

## 2021-02-28 LAB — VITAMIN D 25 HYDROXY (VIT D DEFICIENCY, FRACTURES): Vit D, 25-Hydroxy: 50.3 ng/mL (ref 30.0–100.0)

## 2021-02-28 LAB — THYROID PANEL WITH TSH
Free Thyroxine Index: 2.1 (ref 1.2–4.9)
T3 Uptake Ratio: 26 % (ref 24–39)
T4, Total: 7.9 ug/dL (ref 4.5–12.0)
TSH: 2.69 u[IU]/mL (ref 0.450–4.500)

## 2021-03-04 ENCOUNTER — Telehealth: Payer: Self-pay | Admitting: Nurse Practitioner

## 2021-03-04 NOTE — Telephone Encounter (Signed)
Form completed and left up front for patient pick up Called patient and notified. Patient advised she would pick up today

## 2021-10-01 ENCOUNTER — Encounter: Payer: Self-pay | Admitting: Family Medicine

## 2021-10-01 ENCOUNTER — Ambulatory Visit (INDEPENDENT_AMBULATORY_CARE_PROVIDER_SITE_OTHER): Payer: Medicaid Other | Admitting: Family Medicine

## 2021-10-01 DIAGNOSIS — S61011A Laceration without foreign body of right thumb without damage to nail, initial encounter: Secondary | ICD-10-CM | POA: Diagnosis not present
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
## 2021-10-02 ENCOUNTER — Other Ambulatory Visit (INDEPENDENT_AMBULATORY_CARE_PROVIDER_SITE_OTHER): Payer: Medicaid Other

## 2021-10-02 DIAGNOSIS — S61011A Laceration without foreign body of right thumb without damage to nail, initial encounter: Secondary | ICD-10-CM

## 2021-11-16 IMAGING — US US ABDOMEN LIMITED
1 series · 14 of 25 positions shown · non-contrast
Comparison: None.

CLINICAL DATA: Upper abdominal pain

EXAM:
ULTRASOUND ABDOMEN LIMITED RIGHT UPPER QUADRANT

[Series 1: us abdomen limited · 0.22mm/px · 14 of 44 slices shown]
[im 1/44]
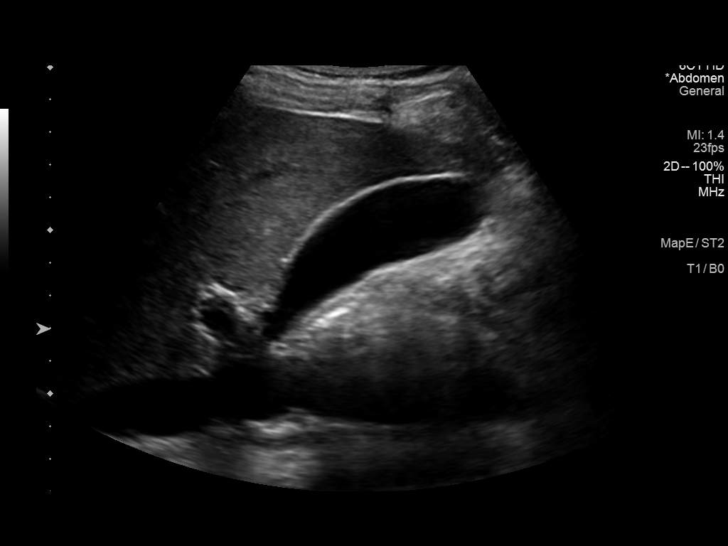
[im 4/44]
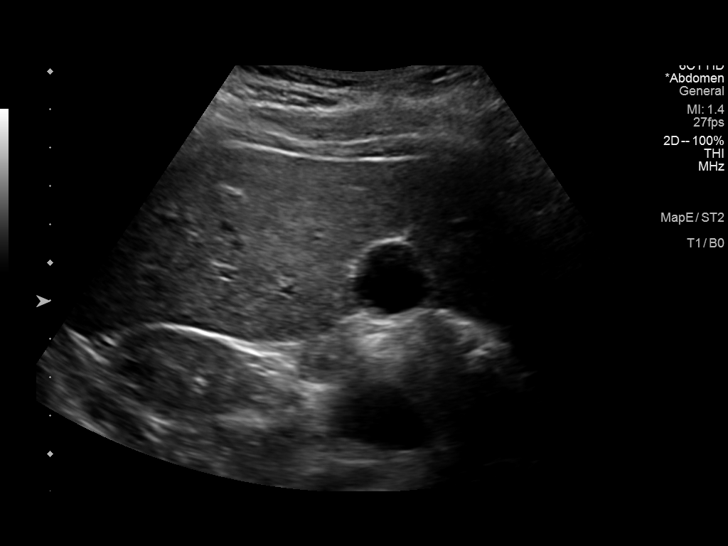
[im 8/44]
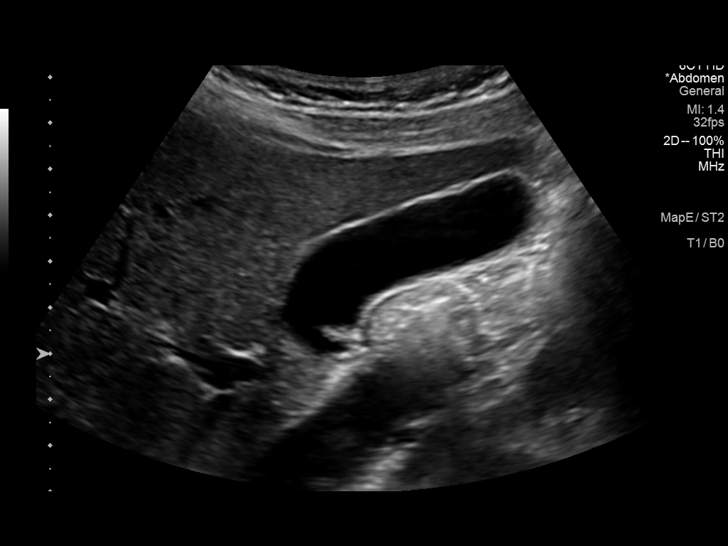
[im 11/44]
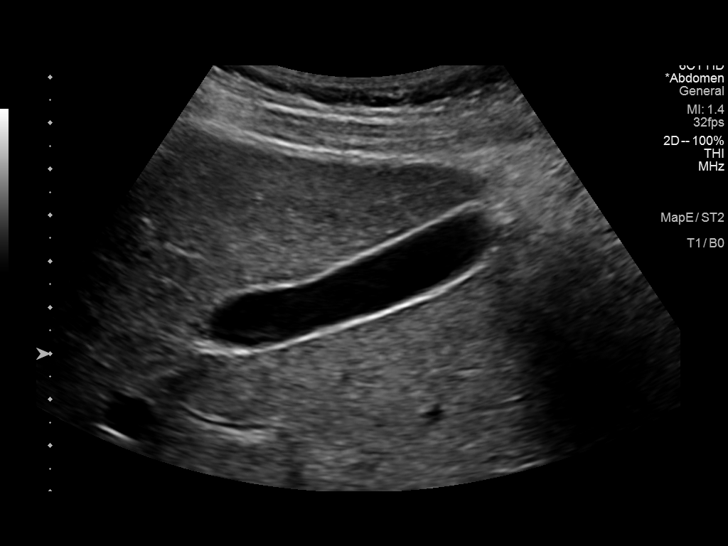
[im 15/44]
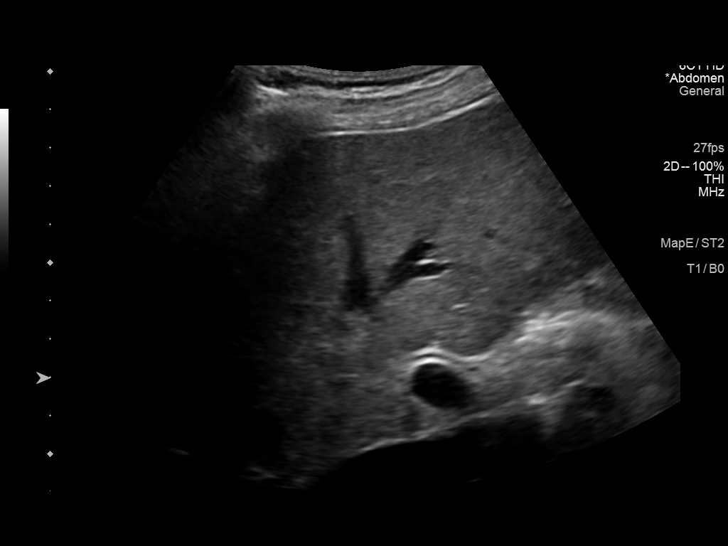
[im 17/44]
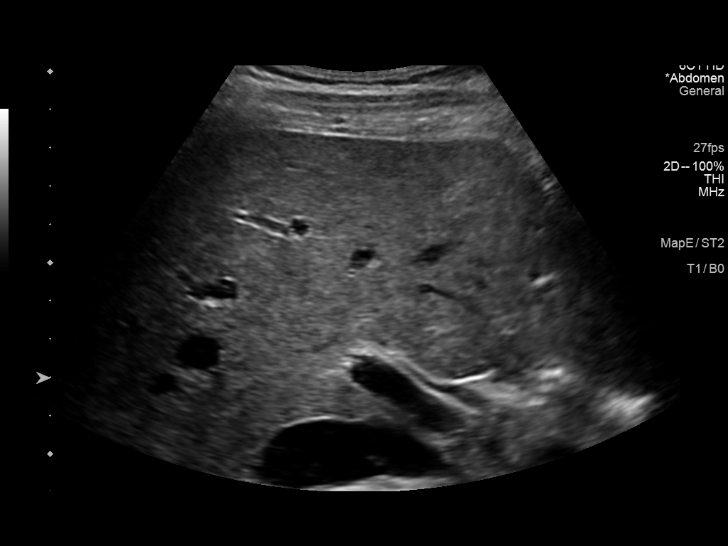
[im 20/44]
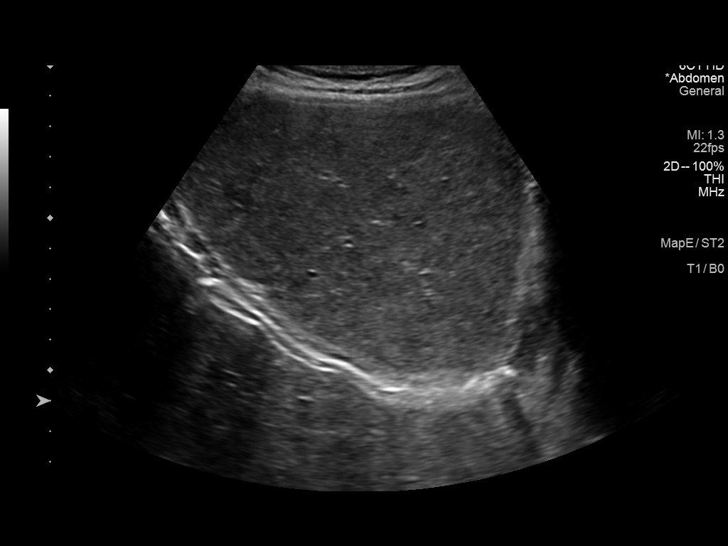
[im 24/44]
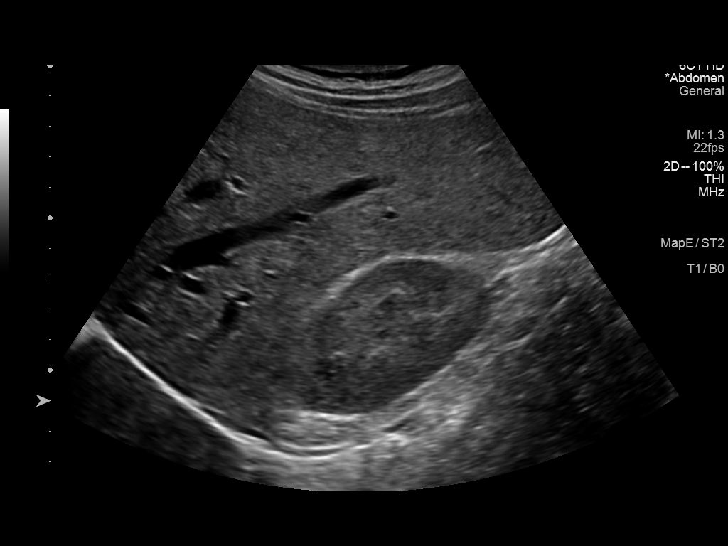
[im 27/44]
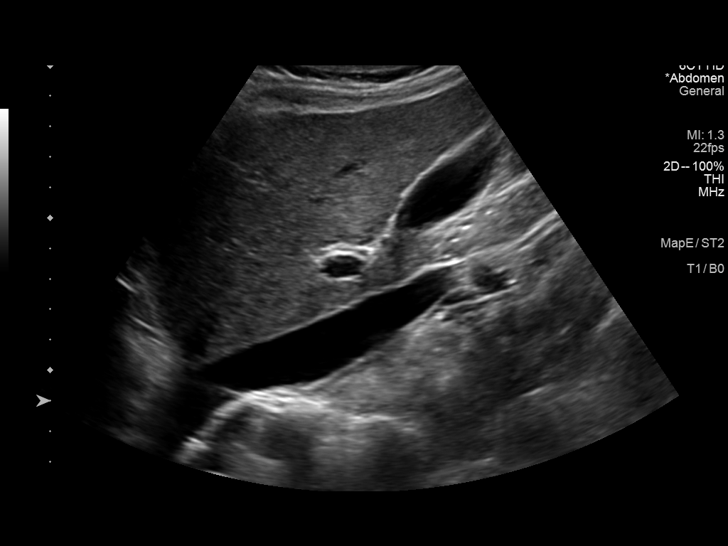
[im 29/44]
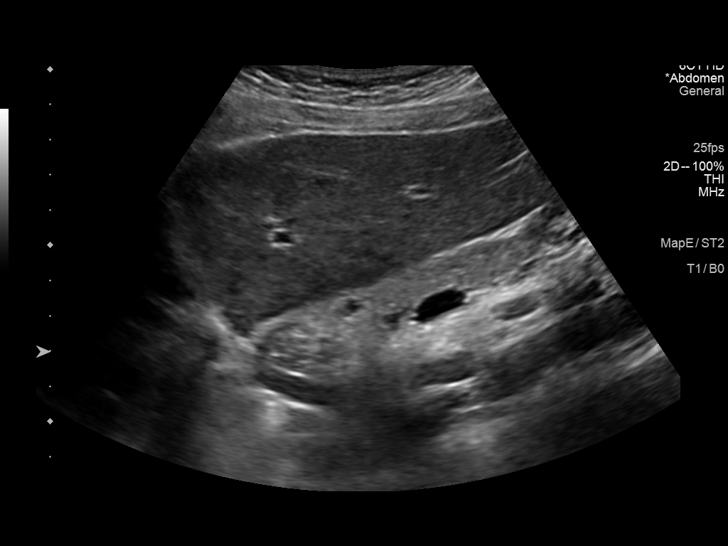
[im 33/44]
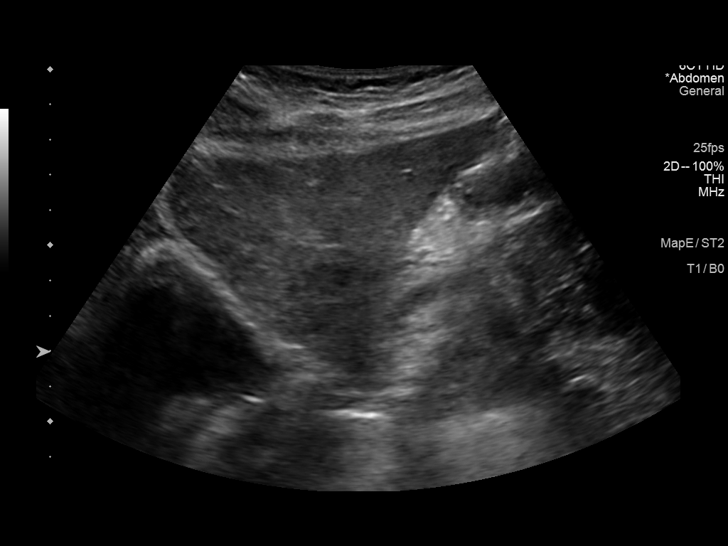
[im 36/44]
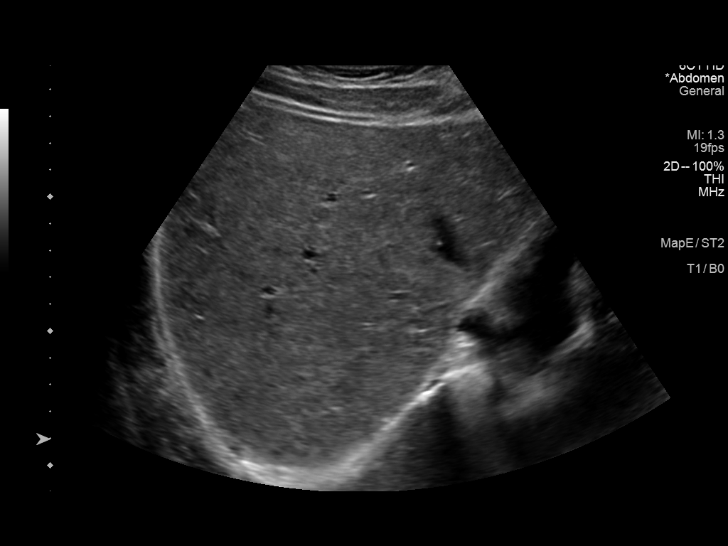
[im 40/44]
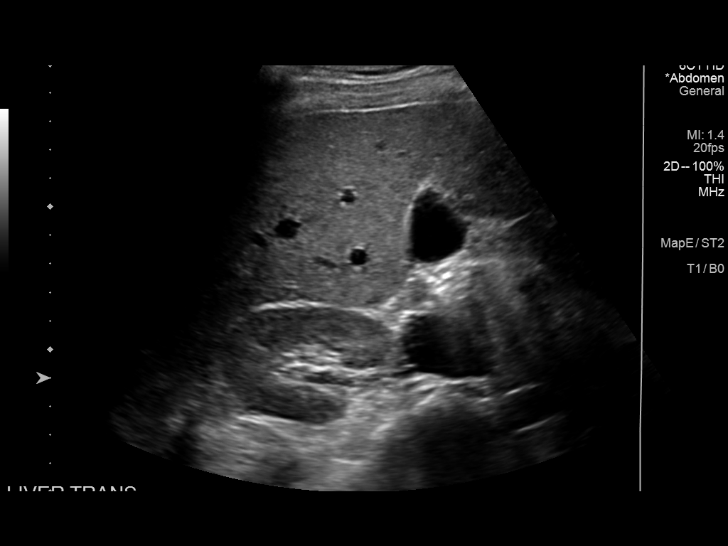
[im 44/44]
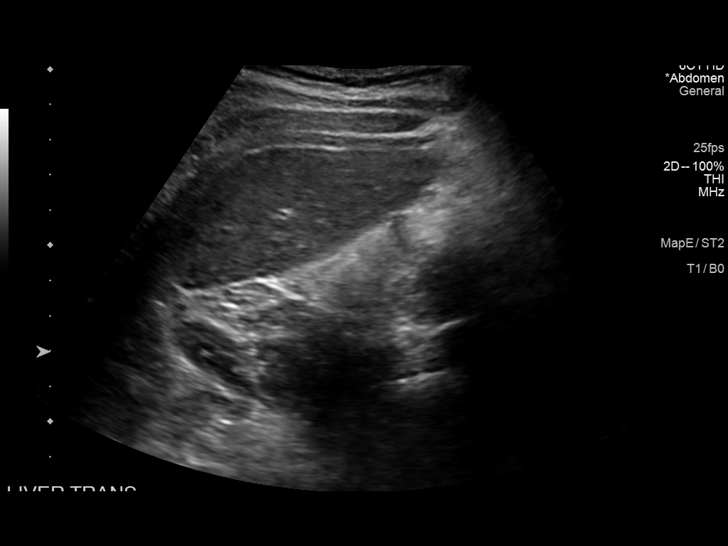

[14 of 25 positions shown; findings below may reference images not displayed]

FINDINGS: Gallbladder:

No gallstones or wall thickening visualized. There is no
pericholecystic fluid. No sonographic Murphy sign noted by
sonographer.

Common bile duct:

Diameter: 1 mm. No intrahepatic or extrahepatic biliary duct
dilatation.

Liver:

No focal lesion identified. Within normal limits in parenchymal
echogenicity. Portal vein is patent on color Doppler imaging with
normal direction of blood flow towards the liver.

Other: None.
IMPRESSION: Study within normal limits.

## 2022-08-13 DIAGNOSIS — J209 Acute bronchitis, unspecified: Secondary | ICD-10-CM | POA: Diagnosis not present

## 2022-08-13 DIAGNOSIS — J0100 Acute maxillary sinusitis, unspecified: Secondary | ICD-10-CM | POA: Diagnosis not present

## 2023-05-05 LAB — HEPATITIS C ANTIBODY: HCV Ab: NEGATIVE

## 2023-05-05 LAB — OB RESULTS CONSOLE RPR: RPR: NONREACTIVE

## 2023-05-05 LAB — OB RESULTS CONSOLE HEPATITIS B SURFACE ANTIGEN: Hepatitis B Surface Ag: NEGATIVE

## 2023-05-05 LAB — OB RESULTS CONSOLE HIV ANTIBODY (ROUTINE TESTING): HIV: NONREACTIVE

## 2023-05-05 LAB — OB RESULTS CONSOLE RUBELLA ANTIBODY, IGM: Rubella: NON-IMMUNE/NOT IMMUNE

## 2023-05-05 LAB — OB RESULTS CONSOLE ANTIBODY SCREEN: Antibody Screen: NEGATIVE

## 2023-10-15 ENCOUNTER — Encounter (HOSPITAL_COMMUNITY): Payer: Self-pay | Admitting: *Deleted

## 2023-10-15 ENCOUNTER — Encounter (HOSPITAL_COMMUNITY): Payer: Self-pay

## 2023-10-15 LAB — OB RESULTS CONSOLE GBS: GBS: NEGATIVE

## 2023-10-15 NOTE — Patient Instructions (Signed)
 ZARAHI FUERST  10/15/2023   Your procedure is scheduled on:  11/01/2023  Arrive at 1015 at Entrance C on [address] at [facility]  and [address]. You are invited to use the FREE valet parking or use the Visitor's parking deck.  Pick up the phone at the desk and dial [phone].  Call this number if you have problems the morning of surgery: [phone]  Remember:   Do not eat food:(After Midnight) Desps de medianoche.  You may drink clear liquids until arrival at ___1015__.  Clear liquids means a liquid you can see thru.  It can have color such as Cola or Kool aid.  Tea is OK and coffee as long as no milk or creamer of any kind.  Take these medicines the morning of surgery with A SIP OF WATER:  none   Do not wear jewelry, make-up or nail polish.  Do not wear lotions, powders, or perfumes. Do not wear deodorant.  Do not shave 48 hours prior to surgery.  Do not bring valuables to the hospital.  [facility] is not   responsible for any belongings or valuables brought to the hospital.  Contacts, dentures or bridgework may not be worn into surgery.  Leave suitcase in the car. After surgery it may be brought to your room.  For patients admitted to the hospital, checkout time is 11:00 AM the day of              discharge.      Please read over the following fact sheets that you were given:     Preparing for Surgery

## 2023-10-29 ENCOUNTER — Encounter (HOSPITAL_COMMUNITY)
Admission: RE | Admit: 2023-10-29 | Discharge: 2023-10-29 | Disposition: A | Discharge status: Home / Self Care | Payer: Self-pay | Source: Ambulatory Visit | Attending: Obstetrics | Admitting: Obstetrics

## 2023-10-29 DIAGNOSIS — Z01812 Encounter for preprocedural laboratory examination: Secondary | ICD-10-CM | POA: Insufficient documentation

## 2023-10-29 DIAGNOSIS — Z98891 History of uterine scar from previous surgery: Secondary | ICD-10-CM | POA: Insufficient documentation

## 2023-10-29 LAB — CBC
HCT: 35.6 % — ABNORMAL LOW (ref 36.0–46.0)
Hemoglobin: 12.3 g/dL (ref 12.0–15.0)
MCH: 31.9 pg (ref 26.0–34.0)
MCHC: 34.6 g/dL (ref 30.0–36.0)
MCV: 92.2 fL (ref 80.0–100.0)
Platelets: 179 10*3/uL (ref 150–400)
RBC: 3.86 MIL/uL — ABNORMAL LOW (ref 3.87–5.11)
RDW: 12.9 % (ref 11.5–15.5)
WBC: 7.1 10*3/uL (ref 4.0–10.5)
nRBC: 0 % (ref 0.0–0.2)

## 2023-10-29 LAB — RPR: RPR Ser Ql: NONREACTIVE

## 2023-10-29 LAB — TYPE AND SCREEN
ABO/RH(D): O POS
Antibody Screen: NEGATIVE

## 2023-11-01 ENCOUNTER — Encounter (HOSPITAL_COMMUNITY)
Admission: AD | Disposition: A | Discharge status: Home / Self Care | Payer: Self-pay | Source: Home / Self Care | Attending: Obstetrics

## 2023-11-01 ENCOUNTER — Inpatient Hospital Stay (HOSPITAL_COMMUNITY): Admitting: Anesthesiology

## 2023-11-01 ENCOUNTER — Other Ambulatory Visit: Payer: Self-pay

## 2023-11-01 ENCOUNTER — Inpatient Hospital Stay (HOSPITAL_COMMUNITY)
Admission: AD | Admit: 2023-11-01 | Discharge: 2023-11-03 | DRG: 785 | Disposition: A | Discharge status: Home / Self Care | Payer: Medicaid Other | Attending: Obstetrics | Admitting: Obstetrics

## 2023-11-01 ENCOUNTER — Encounter (HOSPITAL_COMMUNITY): Payer: Self-pay | Admitting: Obstetrics

## 2023-11-01 DIAGNOSIS — Z3A39 39 weeks gestation of pregnancy: Secondary | ICD-10-CM | POA: Diagnosis not present

## 2023-11-01 DIAGNOSIS — O34211 Maternal care for low transverse scar from previous cesarean delivery: Secondary | ICD-10-CM | POA: Diagnosis present

## 2023-11-01 DIAGNOSIS — Z87891 Personal history of nicotine dependence: Secondary | ICD-10-CM | POA: Diagnosis not present

## 2023-11-01 DIAGNOSIS — Z98891 History of uterine scar from previous surgery: Principal | ICD-10-CM

## 2023-11-01 DIAGNOSIS — Z302 Encounter for sterilization: Secondary | ICD-10-CM | POA: Diagnosis not present

## 2023-11-01 DIAGNOSIS — Z3490 Encounter for supervision of normal pregnancy, unspecified, unspecified trimester: Secondary | ICD-10-CM

## 2023-11-01 SURGERY — Surgical Case
Anesthesia: Spinal

## 2023-11-01 MED ORDER — WITCH HAZEL-GLYCERIN EX PADS: 1.0000 | MEDICATED_PAD | CUTANEOUS | Status: DC | PRN

## 2023-11-01 MED ORDER — NALOXONE HCL 4 MG/10ML IJ SOLN: 1.0000 ug/kg/h | INTRAVENOUS | Status: DC | PRN

## 2023-11-01 MED ORDER — MEPERIDINE HCL 25 MG/ML IJ SOLN: 6.2500 mg | INTRAMUSCULAR | Status: DC | PRN

## 2023-11-01 MED ORDER — HYDROMORPHONE HCL 1 MG/ML IJ SOLN: 0.2500 mg | INTRAMUSCULAR | Status: DC | PRN

## 2023-11-01 MED ORDER — ACETAMINOPHEN 500 MG PO TABS
1000.0000 mg | ORAL_TABLET | Freq: Four times a day (QID) | ORAL | Status: DC
  Administered 2023-11-01 – 2023-11-03 (×8): 1000 mg via ORAL
  Filled 2023-11-01 (×8): qty 2

## 2023-11-01 MED ORDER — SCOPOLAMINE 1 MG/3DAYS TD PT72
MEDICATED_PATCH | TRANSDERMAL | Status: AC
  Filled 2023-11-01: qty 1

## 2023-11-01 MED ORDER — DIPHENHYDRAMINE HCL 50 MG/ML IJ SOLN: 12.5000 mg | INTRAMUSCULAR | Status: DC | PRN

## 2023-11-01 MED ORDER — CEFAZOLIN SODIUM-DEXTROSE 2-4 GM/100ML-% IV SOLN
2.0000 g | INTRAVENOUS | Status: AC
  Administered 2023-11-01: 2 g via INTRAVENOUS

## 2023-11-01 MED ORDER — ACETAMINOPHEN 10 MG/ML IV SOLN
INTRAVENOUS | Status: DC | PRN
  Administered 2023-11-01: 1000 mg via INTRAVENOUS

## 2023-11-01 MED ORDER — SIMETHICONE 80 MG PO CHEW
80.0000 mg | CHEWABLE_TABLET | Freq: Three times a day (TID) | ORAL | Status: DC
  Administered 2023-11-01 – 2023-11-03 (×5): 80 mg via ORAL
  Filled 2023-11-01 (×5): qty 1

## 2023-11-01 MED ORDER — IBUPROFEN 600 MG PO TABS: 600.0000 mg | ORAL_TABLET | Freq: Four times a day (QID) | ORAL | Status: DC

## 2023-11-01 MED ORDER — DROPERIDOL 2.5 MG/ML IJ SOLN: 0.6250 mg | Freq: Once | INTRAMUSCULAR | Status: DC | PRN

## 2023-11-01 MED ORDER — DEXAMETHASONE SODIUM PHOSPHATE 10 MG/ML IJ SOLN
INTRAMUSCULAR | Status: DC | PRN
  Administered 2023-11-01: 10 mg via INTRAVENOUS

## 2023-11-01 MED ORDER — PHENYLEPHRINE HCL-NACL 20-0.9 MG/250ML-% IV SOLN
INTRAVENOUS | Status: DC | PRN
  Administered 2023-11-01: 60 ug/min via INTRAVENOUS

## 2023-11-01 MED ORDER — SODIUM CHLORIDE 0.9% FLUSH: 3.0000 mL | INTRAVENOUS | Status: DC | PRN

## 2023-11-01 MED ORDER — FENTANYL CITRATE (PF) 100 MCG/2ML IJ SOLN
INTRAMUSCULAR | Status: AC
  Filled 2023-11-01: qty 2

## 2023-11-01 MED ORDER — KETOROLAC TROMETHAMINE 30 MG/ML IJ SOLN
30.0000 mg | Freq: Once | INTRAMUSCULAR | Status: AC | PRN
  Administered 2023-11-01: 30 mg via INTRAVENOUS

## 2023-11-01 MED ORDER — LACTATED RINGERS IV SOLN: INTRAVENOUS | Status: DC

## 2023-11-01 MED ORDER — MORPHINE SULFATE (PF) 0.5 MG/ML IJ SOLN
INTRAMUSCULAR | Status: DC | PRN
  Administered 2023-11-01: 150 ug via INTRATHECAL

## 2023-11-01 MED ORDER — POVIDONE-IODINE 10 % EX SWAB
2.0000 | Freq: Once | CUTANEOUS | Status: AC
  Administered 2023-11-01: 2 via TOPICAL

## 2023-11-01 MED ORDER — DIBUCAINE (PERIANAL) 1 % EX OINT: 1.0000 | TOPICAL_OINTMENT | CUTANEOUS | Status: DC | PRN

## 2023-11-01 MED ORDER — SIMETHICONE 80 MG PO CHEW: 80.0000 mg | CHEWABLE_TABLET | ORAL | Status: DC | PRN

## 2023-11-01 MED ORDER — MENTHOL 3 MG MT LOZG: 1.0000 | LOZENGE | OROMUCOSAL | Status: DC | PRN

## 2023-11-01 MED ORDER — KETOROLAC TROMETHAMINE 30 MG/ML IJ SOLN
30.0000 mg | Freq: Four times a day (QID) | INTRAMUSCULAR | Status: DC
  Filled 2023-11-01: qty 1

## 2023-11-01 MED ORDER — ONDANSETRON HCL 4 MG/2ML IJ SOLN: 4.0000 mg | Freq: Three times a day (TID) | INTRAMUSCULAR | Status: DC | PRN

## 2023-11-01 MED ORDER — SENNOSIDES-DOCUSATE SODIUM 8.6-50 MG PO TABS
2.0000 | ORAL_TABLET | ORAL | Status: DC
  Filled 2023-11-01: qty 2

## 2023-11-01 MED ORDER — SCOPOLAMINE 1 MG/3DAYS TD PT72
1.0000 | MEDICATED_PATCH | Freq: Once | TRANSDERMAL | Status: DC
  Administered 2023-11-01: 1.5 mg via TRANSDERMAL

## 2023-11-01 MED ORDER — DIPHENHYDRAMINE HCL 25 MG PO CAPS: 25.0000 mg | ORAL_CAPSULE | ORAL | Status: DC | PRN

## 2023-11-01 MED ORDER — OXYTOCIN-SODIUM CHLORIDE 30-0.9 UT/500ML-% IV SOLN
INTRAVENOUS | Status: DC | PRN
  Administered 2023-11-01: 300 mL via INTRAVENOUS

## 2023-11-01 MED ORDER — PRENATAL MULTIVITAMIN CH
1.0000 | ORAL_TABLET | Freq: Every day | ORAL | Status: DC
  Administered 2023-11-02 – 2023-11-03 (×2): 1 via ORAL
  Filled 2023-11-01 (×2): qty 1

## 2023-11-01 MED ORDER — FENTANYL CITRATE (PF) 100 MCG/2ML IJ SOLN
INTRAMUSCULAR | Status: DC | PRN
  Administered 2023-11-01: 15 ug via INTRATHECAL

## 2023-11-01 MED ORDER — CEFAZOLIN SODIUM-DEXTROSE 2-4 GM/100ML-% IV SOLN
INTRAVENOUS | Status: AC
  Filled 2023-11-01: qty 100

## 2023-11-01 MED ORDER — COCONUT OIL OIL: 1.0000 | TOPICAL_OIL | Status: DC | PRN

## 2023-11-01 MED ORDER — DIPHENHYDRAMINE HCL 25 MG PO CAPS: 25.0000 mg | ORAL_CAPSULE | Freq: Four times a day (QID) | ORAL | Status: DC | PRN

## 2023-11-01 MED ORDER — OXYCODONE HCL 5 MG PO TABS: 5.0000 mg | ORAL_TABLET | ORAL | Status: DC | PRN

## 2023-11-01 MED ORDER — BUPIVACAINE IN DEXTROSE 0.75-8.25 % IT SOLN
INTRATHECAL | Status: DC | PRN
  Administered 2023-11-01: 1.6 mL via INTRATHECAL

## 2023-11-01 MED ORDER — ONDANSETRON HCL 4 MG/2ML IJ SOLN
INTRAMUSCULAR | Status: DC | PRN
  Administered 2023-11-01: 4 mg via INTRAVENOUS

## 2023-11-01 MED ORDER — KETOROLAC TROMETHAMINE 30 MG/ML IJ SOLN
INTRAMUSCULAR | Status: AC
  Filled 2023-11-01: qty 1

## 2023-11-01 MED ORDER — NALOXONE HCL 0.4 MG/ML IJ SOLN: 0.4000 mg | INTRAMUSCULAR | Status: DC | PRN

## 2023-11-01 MED ORDER — MORPHINE SULFATE (PF) 0.5 MG/ML IJ SOLN
INTRAMUSCULAR | Status: AC
Start: 2023-11-01 — End: ?
  Filled 2023-11-01: qty 10

## 2023-11-01 MED ORDER — OXYTOCIN-SODIUM CHLORIDE 30-0.9 UT/500ML-% IV SOLN
2.5000 [IU]/h | INTRAVENOUS | Status: AC
Start: 2023-11-01 — End: 2023-11-02
  Administered 2023-11-01: 2.5 [IU]/h via INTRAVENOUS
  Filled 2023-11-01: qty 500

## 2023-11-01 SURGICAL SUPPLY — 29 items
BENZOIN TINCTURE PRP APPL 2/3 (GAUZE/BANDAGES/DRESSINGS) ×1 IMPLANT
CHLORAPREP W/TINT 26 (MISCELLANEOUS) ×2 IMPLANT
CLAMP UMBILICAL CORD (MISCELLANEOUS) ×1 IMPLANT
CLOTH BEACON ORANGE TIMEOUT ST (SAFETY) ×1 IMPLANT
DRSG OPSITE POSTOP 4X10 (GAUZE/BANDAGES/DRESSINGS) ×1 IMPLANT
ELECT REM PT RETURN 9FT ADLT (ELECTROSURGICAL) ×1 IMPLANT
ELECTRODE REM PT RTRN 9FT ADLT (ELECTROSURGICAL) ×1 IMPLANT
EXTRACTOR VACUUM KIWI (MISCELLANEOUS) IMPLANT
GLOVE BIOGEL M STER SZ 6 (GLOVE) ×1 IMPLANT
GLOVE BIOGEL PI IND STRL 6.5 (GLOVE) ×1 IMPLANT
GOWN STRL REUS W/TWL LRG LVL3 (GOWN DISPOSABLE) ×2 IMPLANT
KIT ABG SYR 3ML LUER SLIP (SYRINGE) IMPLANT
NDL HYPO 25X5/8 SAFETYGLIDE (NEEDLE) IMPLANT
NEEDLE HYPO 25X5/8 SAFETYGLIDE (NEEDLE) IMPLANT
NS IRRIG 1000ML POUR BTL (IV SOLUTION) ×1 IMPLANT
PACK C SECTION WH (CUSTOM PROCEDURE TRAY) ×1 IMPLANT
PAD OB MATERNITY 4.3X12.25 (PERSONAL CARE ITEMS) ×1 IMPLANT
RTRCTR C-SECT PINK 25CM LRG (MISCELLANEOUS) ×1 IMPLANT
STRIP CLOSURE SKIN 1/2X4 (GAUZE/BANDAGES/DRESSINGS) ×1 IMPLANT
SUT MNCRL 0 VIOLET CTX 36 (SUTURE) ×2 IMPLANT
SUT PDS AB 0 CTX 60 (SUTURE) IMPLANT
SUT PLAIN 0 NONE (SUTURE) IMPLANT
SUT PLAIN ABS 2-0 CT1 27XMFL (SUTURE) ×1 IMPLANT
SUT VIC AB 0 CTX36XBRD ANBCTRL (SUTURE) ×2 IMPLANT
SUT VIC AB 2-0 CT1 TAPERPNT 27 (SUTURE) ×1 IMPLANT
SUT VIC AB 4-0 PS2 27 (SUTURE) ×1 IMPLANT
TOWEL OR 17X24 6PK STRL BLUE (TOWEL DISPOSABLE) ×1 IMPLANT
TRAY FOLEY W/BAG SLVR 14FR LF (SET/KITS/TRAYS/PACK) ×1 IMPLANT
WATER STERILE IRR 1000ML POUR (IV SOLUTION) ×1 IMPLANT

## 2023-11-01 NOTE — Anesthesia Preprocedure Evaluation (Addendum)
 Anesthesia Evaluation  Patient identified by MRN, date of birth, ID band Patient awake    Reviewed: Allergy & Precautions, NPO status , Patient's Chart, lab work & pertinent test results  History of Anesthesia Complications Negative for: history of anesthetic complications  Airway Mallampati: I  TM Distance: >3 FB Neck ROM: Full    Dental  (+) Teeth Intact, Dental Advisory Given   Pulmonary former smoker   Pulmonary exam normal breath sounds clear to auscultation       Cardiovascular negative cardio ROS Normal cardiovascular exam Rhythm:Regular Rate:Normal     Neuro/Psych  PSYCHIATRIC DISORDERS Anxiety     negative neurological ROS     GI/Hepatic negative GI ROS, Neg liver ROS,,,  Endo/Other  negative endocrine ROS    Renal/GU negative Renal ROS     Musculoskeletal negative musculoskeletal ROS (+)    Abdominal  (+) + obese  Peds  Hematology negative hematology ROS (+)   Anesthesia Other Findings Day of surgery medications reviewed with the patient.  Reproductive/Obstetrics (+) Pregnancy Breech presentation                             Anesthesia Physical Anesthesia Plan  ASA: 2  Anesthesia Plan: Spinal   Post-op Pain Management: Tylenol PO (pre-op)* and Toradol IV (intra-op)*   Induction:   PONV Risk Score and Plan: 2 and Treatment may vary due to age or medical condition, Ondansetron, Dexamethasone and Scopolamine patch - Pre-op  Airway Management Planned: Natural Airway  Additional Equipment:   Intra-op Plan:   Post-operative Plan:   Informed Consent: I have reviewed the patients History and Physical, chart, labs and discussed the procedure including the risks, benefits and alternatives for the proposed anesthesia with the patient or authorized representative who has indicated his/her understanding and acceptance.     Dental advisory given  Plan Discussed with:  CRNA  Anesthesia Plan Comments:         Anesthesia Quick Evaluation

## 2023-11-01 NOTE — Op Note (Addendum)
 Cesarean Section Procedure Note  Pre-operative Diagnosis: 1. Intrauterine pregnancy at 106w3d  2. Undesired fertility  Post-operative Diagnosis: same as above  Surgeon: Marlow Baars, MD  Assistants: Derl Barrow, MD  Procedure: Repeat low transverse cesarean section with bilateral salpingectomy  Anesthesia: Spinal anesthesia  Estimated Blood Loss: 129 mL         Drains: Foley catheter         Specimens: bilateral fallopian tubes to pathology         Implants: none         Complications:  None; patient tolerated the procedure well.         Disposition: PACU - hemodynamically stable.  Findings:  Normal uterus, tubes and ovaries bilaterally.  Viable female infant, 3700g (8lb 2.5 oz) Apgars 9, 9 .    Procedure Details     After spinal anesthesia was found to adequate , the patient was placed in the dorsal supine position with a leftward tilt, prepped and draped in the usual sterile manner. A Pfannenstiel incision was made through the prior scar and carried down through the subcutaneous tissue to the fascia.  The fascia was incised in the midline and the fascial incision was extended laterally with Mayo scissors. The superior aspect of the fascial incision was grasped with two Kocher clamp, tented up and the rectus muscles dissected off bluntly. The rectus was then dissected off with blunt dissection and Mayo scissors inferiorly. The rectus muscles were separated in the midline. The abdominal peritoneum was identified, tented up, entered sharply, and the incision was extended superiorly and inferiorly with good visualization of the bladder. The vesicouterine peritoneum was identified, tented up, entered bluntly.  The Alexis retractor was deployed and the bladder flap was created digitally. Scalpel was then used to make a low transverse incision on the uterus which was extended laterally with blunt dissection. The fluid was clear. The fetus was delivered from the vertex position  atraumatically.  After standard 60 second delay, the cord was clamped and cut and the infant was passed to the waiting neonatology team.  Placenta was then delivered spontaneously, intact and appear normal, the uterus was cleared of all clot and debris.   The hysterotomy was repaired with #0 Monocryl in running locked fashion.  An additional figure of eight suture was placed for hemostasis.  At this time, attention was turned to the left fallopian tube.  The tube was grasped with two babcock clamps.  The mesosalpinx was clamped with a kelly adjacent to the tube, ligated with 2-0 vicryl and separated with bovie cautery sequentially until transected at the level of the cornua.  This was repeated on the right side.   The left and right fallopian tubes were sent to pathology.   The serosal edges of the bladder flap were oozy, and bovie cautery was used to achieve hemostasis.  The hysterotomy was reexamined and excellent hemostasis was noted.  The abdominal cavity was cleared of all clot and debris.  The Alexis retractor was removed from the abdomen. The fascia and rectus muscles were inspected and were hemostatic. The peritoneum was closed with 2-0 vicryl in a running fashion. The fascia was closed with 0 Vicryl in a running fashion. The subcuticular layer was irrigated and all bleeders cauterized.  The subcutaneous layer was reapproximated with interrupted 3-0 plain gut.  The skin was closed with 4-0 vicryl in a subcuticular fashion. The incision was dressed with benzoine, steri strips and pressure dressing. All sponge lap and needle counts  were correct x3. Patient tolerated the procedure well and recovered in stable condition following the procedure.  An experienced assistant was required given the standard of surgical care given the complexity of the case.  This assistant was needed for exposure, dissection, suctioning, retraction, instrument exchange, assisting with delivery with administration of fundal  pressure, and for overall help during the procedure.

## 2023-11-01 NOTE — Anesthesia Postprocedure Evaluation (Signed)
 Anesthesia Post Note  Patient: Kayla Stone  Procedure(s) Performed: CESAREAN SECTION WITH BILATERAL SALPINGECTOMY     Patient location during evaluation: PACU Anesthesia Type: Spinal Level of consciousness: awake and alert Pain management: pain level controlled Vital Signs Assessment: post-procedure vital signs reviewed and stable Respiratory status: spontaneous breathing Cardiovascular status: stable Anesthetic complications: no   No notable events documented.  Last Vitals:  Vitals:   11/01/23 1330 11/01/23 1347  BP: 124/69 107/71  Pulse: 65 64  Resp: 11 18  Temp:  36.6 C  SpO2: 94% 98%    Last Pain:  Vitals:   11/01/23 1347  TempSrc: Oral  PainSc: 0-No pain                 Lewie Loron

## 2023-11-01 NOTE — H&P (Signed)
 33 y.o. G2P1001 at [redacted]w[redacted]d presents for repeat cesarean section and bilateral salpingectomy.  Otherwise has good fetal movement and no bleeding.  Pregnancy complicated by: History of cesarean section for breech presentation  Past Medical History:  Diagnosis Date   Medical history non-contributory     Past Surgical History:  Procedure Laterality Date   CESAREAN SECTION N/A 09/02/2018   Procedure: CESAREAN SECTION;  Surgeon: Marlow Baars, MD;  Location: [facility] BIRTHING SUITES;  Service: Obstetrics;  Laterality: N/A;   WISDOM TOOTH EXTRACTION      OB History  Gravida Para Term Preterm AB Living  2 1 1   1   SAB IAB Ectopic Multiple Live Births     0 1    # Outcome Date GA Lbr Len/2nd Weight Sex Type Anes PTL Lv  2 Current           1 Term 09/02/18 [redacted]w[redacted]d  3.265 kg M CS-LTranv Spinal  LIV    Social History   Socioeconomic History   Marital status: Single    Spouse name: Not on file   Number of children: Not on file   Years of education: Not on file   Highest education level: Not on file  Occupational History   Not on file  Tobacco Use   Smoking status: Former    Current packs/day: 0.00    Types: Cigarettes    Quit date: 02/07/2018    Years since quitting: 5.7   Smokeless tobacco: Never  Vaping Use   Vaping status: Never Used  Substance and Sexual Activity   Alcohol use: Yes    Alcohol/week: 0.0 standard drinks of alcohol   Drug use: No   Sexual activity: Yes    Birth control/protection: Pill  Other Topics Concern   Not on file  Social History Narrative   Not on file   Social Drivers of Health   Financial Resource Strain: Low Risk  (08/22/2018)   Overall Financial Resource Strain (CARDIA)    Difficulty of Paying Living Expenses: Not hard at all  Food Insecurity: No Food Insecurity (11/01/2023)   Hunger Vital Sign    Worried About Running Out of Food in the Last Year: Never true    Ran Out of Food in the Last Year: Never true  Transportation Needs: No Transportation  Needs (11/01/2023)   PRAPARE - Administrator, Civil Service (Medical): No    Lack of Transportation (Non-Medical): No  Physical Activity: Not on file  Stress: Not on file (06/18/2023)  Social Connections: Not on file  Intimate Partner Violence: Not At Risk (11/01/2023)   Humiliation, Afraid, Rape, and Kick questionnaire    Fear of Current or Ex-Partner: No    Emotionally Abused: No    Physically Abused: No    Sexually Abused: No   Patient has no known allergies.    Prenatal Transfer Tool  Maternal Diabetes: No Genetic Screening: Normal Maternal Ultrasounds/Referrals: Normal Fetal Ultrasounds or other Referrals:  None Maternal Substance Abuse:  No Significant Maternal Medications:  None Significant Maternal Lab Results: Group B Strep negative  ABO, Rh: --/--/O POS (03/21 0905) Antibody: NEG (03/21 0905) Rubella: Nonimmune (09/25 0000) RPR: NON REACTIVE (03/21 0930)  HBsAg: Negative (09/25 0000)  HIV: Non-reactive (09/25 0000)  GBS: Negative/-- (03/07 0000) Negative    Vitals:   11/01/23 1048  BP: 133/81  Pulse: 77  Resp: 18  Temp: 97.9 F (36.6 C)  SpO2: 97%     General:  NAD Abdomen:  soft, gravid  Ex:  no edema FHTs:  150    A/P   33 y.o. G2P1001 at [redacted]w[redacted]d for repeat cesarean section and bilateral salpingectomy.  Declines trial of labor and desires elective repeat. Discussed risks of cesarean section to include, but not limited to, infection, bleeding, damage to surrounding strutcures (including bowel, bladder, tubes, ovaries, nerves, vessels, baby), need for additional procedures, risk of blood clot, need for transfusion, tubal failure resulting in pregnancy, including risk of ectopic pregnancy, risk of regret.  She understands that salpingectomy is not reversible.  She elects to proceed.  Consent signed.     Ancef 2gm on call to OR  [facility] GEFFEL Chestine Spore

## 2023-11-01 NOTE — Anesthesia Procedure Notes (Signed)
 Spinal  Patient location during procedure: OR Start time: 11/01/2023 11:35 AM End time: 11/01/2023 11:37 AM Reason for block: surgical anesthesia Staffing Performed by: Algis Greenhouse, CRNA Authorized by: Lewie Loron, MD   Preanesthetic Checklist Completed: patient identified, IV checked, site marked, risks and benefits discussed, surgical consent, monitors and equipment checked, pre-op evaluation and timeout performed Spinal Block Patient position: sitting Prep: DuraPrep Patient monitoring: heart rate, cardiac monitor, continuous pulse ox and blood pressure Approach: midline Location: L3-4 Injection technique: single-shot Needle Needle type: Sprotte  Needle gauge: 24 G Needle length: 9 cm Assessment Sensory level: T4 Events: CSF return

## 2023-11-01 NOTE — Transfer of Care (Signed)
 Immediate Anesthesia Transfer of Care Note  Patient: Kayla Stone  Procedure(s) Performed: CESAREAN SECTION WITH BILATERAL SALPINGECTOMY  Patient Location: PACU  Anesthesia Type:Spinal  Level of Consciousness: awake  Airway & Oxygen Therapy: Patient Spontanous Breathing  Post-op Assessment: Report given to RN  Post vital signs: Reviewed and stable  Last Vitals:  Vitals Value Taken Time  BP    Temp    Pulse    Resp    SpO2      Last Pain:  Vitals:   11/01/23 1040  TempSrc: Oral         Complications: No notable events documented.

## 2023-11-02 ENCOUNTER — Encounter (HOSPITAL_COMMUNITY): Payer: Self-pay | Admitting: Obstetrics

## 2023-11-02 LAB — CBC
HCT: 30.6 % — ABNORMAL LOW (ref 36.0–46.0)
Hemoglobin: 10.8 g/dL — ABNORMAL LOW (ref 12.0–15.0)
MCH: 32.5 pg (ref 26.0–34.0)
MCHC: 35.3 g/dL (ref 30.0–36.0)
MCV: 92.2 fL (ref 80.0–100.0)
Platelets: 193 10*3/uL (ref 150–400)
RBC: 3.32 MIL/uL — ABNORMAL LOW (ref 3.87–5.11)
RDW: 12.7 % (ref 11.5–15.5)
WBC: 14.4 10*3/uL — ABNORMAL HIGH (ref 4.0–10.5)
nRBC: 0 % (ref 0.0–0.2)

## 2023-11-02 MED ORDER — IBUPROFEN 600 MG PO TABS
600.0000 mg | ORAL_TABLET | Freq: Four times a day (QID) | ORAL | Status: DC
  Administered 2023-11-02 – 2023-11-03 (×5): 600 mg via ORAL
  Filled 2023-11-02 (×5): qty 1

## 2023-11-02 NOTE — Progress Notes (Signed)
 [name] was referred for history of anxiety.  * Referral screened out by Clinical Social Worker because none of the following criteria appear to apply:  ~ History of anxiety  during this pregnancy, or of post-partum depression following prior delivery.  ~ Diagnosis of anxiety within last 3 years  Per OB notes, [name] did not indicate any signs/symptoms during her pregnancy. Anxiety 2016  OR  * [name]'s symptoms currently being treated with medication and/or therapy.  Please contact the Clinical Social Worker if needs arise, by [facility] request, or if [name] scores greater than 9/yes to question 10 on Edinburgh Postpartum Depression Screen.  Kayla Stone, Theresia Majors Clinical Social Worker [phone]

## 2023-11-02 NOTE — Progress Notes (Signed)
 Subjective: Postpartum Day 1: Cesarean Delivery Patient reports pain controlled, no nausea or vomiting.  Ambulating and voiding without difficulty.  Tolerating p.o.  Objective: Vital signs in last 24 hours: Temp:  [97.6 F (36.4 C)-98.8 F (37.1 C)] 98.8 F (37.1 C) (03/25 0525) Pulse Rate:  [51-69] 53 (03/25 0525) Resp:  [11-20] 18 (03/25 0525) BP: (103-124)/(57-71) 107/63 (03/25 0525) SpO2:  [93 %-99 %] 98 % (03/25 0525)  Physical Exam:  General: alert, cooperative, and appears stated age Lochia: appropriate Uterine Fundus: firm Incision: healing well DVT Evaluation: No evidence of DVT seen on physical exam.  Recent Labs    11/02/23 0509  HGB 10.8*  HCT 30.6*    Assessment/Plan: Status post Cesarean section. Doing well postoperatively.  Continue current care.  Waynard Reeds, MD 11/02/2023, 11:39 AM

## 2023-11-02 NOTE — Lactation Note (Signed)
 This note was copied from a baby's chart. Lactation Consultation Note  Patient Name: Kayla Stone [id number] Date: 11/02/2023 Age:33 hours [name] declined [facility] services Per RN.   Maternal Data    Feeding    LATCH Score                    Lactation Tools Discussed/Used    Interventions    Discharge    Consult Status      Kayla Stone 11/02/2023, 10:40 PM

## 2023-11-03 LAB — BIRTH TISSUE RECOVERY COLLECTION (PLACENTA DONATION)

## 2023-11-03 LAB — SURGICAL PATHOLOGY

## 2023-11-03 MED ORDER — MEASLES, MUMPS & RUBELLA VAC IJ SOLR: 0.5000 mL | Freq: Once | INTRAMUSCULAR | Status: DC

## 2023-11-03 MED ORDER — OXYCODONE HCL 5 MG PO TABS: 5.0000 mg | ORAL_TABLET | ORAL | 0 refills | Status: AC | PRN

## 2023-11-03 MED ORDER — IBUPROFEN 600 MG PO TABS: 600.0000 mg | ORAL_TABLET | Freq: Three times a day (TID) | ORAL | 1 refills | Status: DC

## 2023-11-03 MED ORDER — SODIUM CHLORIDE 0.9% FLUSH: 3.0000 mL | Freq: Two times a day (BID) | INTRAVENOUS | Status: DC

## 2023-11-03 MED ORDER — ACETAMINOPHEN 500 MG PO TABS: 1000.0000 mg | ORAL_TABLET | Freq: Three times a day (TID) | ORAL | 0 refills | Status: DC

## 2023-11-03 MED ORDER — SODIUM CHLORIDE 0.9% FLUSH: 3.0000 mL | INTRAVENOUS | Status: DC | PRN

## 2023-11-03 NOTE — Discharge Summary (Signed)
 Postpartum Discharge Summary  Date of Service updated     Patient Name: Kayla Stone DOB: 05-09-1991 MRN: [id number]  Date of admission: 11/01/2023 Delivery date:11/01/2023 Delivering provider: Marlow Baars Date of discharge: 11/03/2023  Admitting diagnosis: Pregnancy [Z34.90] History of cesarean delivery [Z98.891] Intrauterine pregnancy: [redacted]w[redacted]d     Secondary diagnosis:  Principal Problem:   Pregnancy Active Problems:   History of cesarean delivery  Additional problems: N/A    Discharge diagnosis: Term Pregnancy Delivered and Rubella non-immune                                               Post partum procedures: N/A Augmentation: N/A Complications: None  Hospital course: Sceduled C/S   33 y.o. yo G2P2002 at [redacted]w[redacted]d was admitted to the hospital 11/01/2023 for scheduled cesarean section with the following indication:Elective Repeat.Delivery details are as follows:  Membrane Rupture Time/Date: 11:57 AM,11/01/2023  Delivery Method:C-Section, Low Transverse Operative Delivery:N/A Details of operation can be found in separate operative note.  A bilateral salpingectomy was done at the time of her c-section. Patient had an uncomplicated postpartum course. She is ambulating, tolerating a regular diet, passing flatus, and urinating well. Patient is discharged home in stable condition on  11/03/23        Newborn Data: Birth date:11/01/2023 Birth time:11:58 AM Gender:Female Living status:Living Apgars:9 ,9  Weight:3700 g    Magnesium Sulfate received: No BMZ received: No Rhophylac:N/A JXB:JYNWGNF prior to DC T-DaP:Given prenatally Flu: Yes, given prenatally RSV Vaccine received: No Transfusion:No Immunizations administered: Immunization History  Administered Date(s) Administered   DTaP 01/13/1991, 03/21/1991, 05/16/1991, 02/16/1992, 01/04/1996   HIB (PRP-OMP) 01/13/1991, 03/21/1991, 05/16/1991, 02/16/1992   Hepatitis A 06/29/2007, 01/06/2008   Hepatitis B 04/20/2002,  06/15/2002, 11/02/2002   Hpv-Unspecified 02/05/2006, 05/19/2006, 09/23/2006   IPV 01/13/1991, 03/21/1991, 02/16/1992, 01/04/1996   Influenza,inj,Quad PF,6+ Mos 05/30/2018   Influenza-Unspecified 05/05/2023   MMR 02/16/1992, 01/04/1996   Meningococcal Conjugate 06/29/2007   Td 02/05/2006, 05/07/2016   Tdap 02/05/2006, 06/15/2018, 08/18/2023    Physical exam  Vitals:   11/02/23 0525 11/02/23 1446 11/02/23 2018 11/03/23 0534  BP: 107/63 116/61 118/66 123/68  Pulse: (!) 53 73 66 (!) 59  Resp: 18 16 16 18   Temp: 98.8 F (37.1 C) 98.4 F (36.9 C) 98.2 F (36.8 C) 98.1 F (36.7 C)  TempSrc:  Oral Oral Oral  SpO2: 98% 100% 99% 98%  Weight:      Height:       General: alert, cooperative, and no distress Lochia: appropriate Uterine Fundus: firm Incision: Healing well with no significant drainage DVT Evaluation: No evidence of DVT seen on physical exam. No significant calf/ankle edema. Labs: Lab Results  Component Value Date   WBC 14.4 (H) 11/02/2023   HGB 10.8 (L) 11/02/2023   HCT 30.6 (L) 11/02/2023   MCV 92.2 11/02/2023   PLT 193 11/02/2023      Latest Ref Rng & Units 02/27/2021   10:04 AM  CMP  Glucose 65 - 99 mg/dL 89   BUN 6 - 20 mg/dL 8   Creatinine 6.21 - 3.08 mg/dL 6.57   Sodium 846 - 962 mmol/L 139   Potassium 3.5 - 5.2 mmol/L 4.5   Chloride 96 - 106 mmol/L 101   CO2 20 - 29 mmol/L 24   Calcium 8.7 - 10.2 mg/dL 9.6   Total Protein 6.0 -  8.5 g/dL 7.6   Total Bilirubin 0.0 - 1.2 mg/dL 0.7   Alkaline Phos 44 - 121 IU/L 64   AST 0 - 40 IU/L 16   ALT 0 - 32 IU/L 12    Edinburgh Score:    11/02/2023   11:13 PM  Edinburgh Postnatal Depression Scale Screening Tool  I have been able to laugh and see the funny side of things. 0  I have looked forward with enjoyment to things. 0  I have blamed myself unnecessarily when things went wrong. 1  I have been anxious or worried for no good reason. 1  I have felt scared or panicky for no good reason. 0  Things have  been getting on top of me. 0  I have been so unhappy that I have had difficulty sleeping. 0  I have felt sad or miserable. 0  I have been so unhappy that I have been crying. 0  The thought of harming myself has occurred to me. 0  Edinburgh Postnatal Depression Scale Total 2      After visit meds:  Allergies as of 11/03/2023   No Known Allergies      Medication List     TAKE these medications    acetaminophen 500 MG tablet Commonly known as: TYLENOL Take 2 tablets (1,000 mg total) by mouth in the morning, at noon, and at bedtime.   ibuprofen 600 MG tablet Commonly known as: ADVIL Take 1 tablet (600 mg total) by mouth 3 (three) times daily.   oxyCODONE 5 MG immediate release tablet Commonly known as: Oxy IR/ROXICODONE Take 1 tablet (5 mg total) by mouth every 4 (four) hours as needed for up to 5 days for breakthrough pain.   prenatal multivitamin Tabs tablet Take 1 tablet by mouth daily.               Discharge Care Instructions  (From admission, onward)           Start     Ordered   11/03/23 0000  Discharge wound care:       Comments: Remove bandage on 11/06/23   11/03/23 1610             Discharge home in stable condition Infant Feeding: Bottle and Breast Infant Disposition:home with mother Discharge instruction: per After Visit Summary and Postpartum booklet. Activity: Advance as tolerated. Pelvic rest for 6 weeks.  Diet: routine diet Anticipated Birth Control:  s/p bilateral salpingectomy at time of cesarean  Postpartum Appointment:6 weeks Additional Postpartum F/U: Incision check 2 weeks Future Appointments:No future appointments. Follow up Visit:  Follow-up Information     Ob/Gyn, Nestor Ramp. Schedule an appointment as soon as possible for a visit in 2 week(s).   Why: For wound re-check Contact information: [address] [place] [address] [phone]                     11/03/2023 Junius Creamer,  DO

## 2023-11-03 NOTE — Progress Notes (Signed)
 Subjective: Postpartum Day 2: Cesarean Delivery Tolerating PO. Pain controlled. Normal lochia, voiding without difficulty. +flatus.  Combo feeding  Objective: Vital signs in last 24 hours: Temp:  [98.1 F (36.7 C)-98.4 F (36.9 C)] 98.1 F (36.7 C) (03/26 0534) Pulse Rate:  [59-73] 59 (03/26 0534) Resp:  [16-18] 18 (03/26 0534) BP: (116-123)/(61-68) 123/68 (03/26 0534) SpO2:  [98 %-100 %] 98 % (03/26 0534)  Physical Exam:  General: alert, cooperative, and no distress Lochia: appropriate Uterine Fundus: firm Incision: Honeycomb in place, no strikethrough DVT Evaluation: No evidence of DVT seen on physical exam. No significant calf/ankle edema.  Recent Labs    11/02/23 0509  HGB 10.8*  HCT 30.6*    Assessment/Plan: 33 yo G2P2 POD#2 s/p elective repeat c-section with bilateral salpingectomy - PP: doing well, meeting goals - RNI: MMR ordered - Dispo: DC home today  Junius Creamer, DO 11/03/2023, 8:52 AM

## 2023-11-03 NOTE — Discharge Instructions (Signed)
 Call office with any concerns [phone]

## 2023-11-15 ENCOUNTER — Telehealth (HOSPITAL_COMMUNITY): Payer: Self-pay | Admitting: *Deleted

## 2023-11-15 NOTE — Telephone Encounter (Signed)
 11/15/2023  Name: Kayla Stone MRN: [id number] DOB: Oct 19, 1990  Reason for Call:  Transition of Care Hospital Discharge Call  Contact Status: Patient Contact Status: Complete  Language assistant needed: Interpreter Mode: Interpreter Not Needed        Follow-Up Questions: Do You Have Any Concerns About Your Health As You Heal From Delivery?: No Do You Have Any Concerns About Your Infants Health?: No  Edinburgh Postnatal Depression Scale:  In the Past 7 Days:    PHQ2-9 Depression Scale:     Discharge Follow-up: Edinburgh score requires follow up?: N/A (Patient declines reviewing questions; however, states she feels that the answers are the same today as they were in the hosptial. Patient states she feels great emotionally.) Patient was advised of the following resources:: Breastfeeding Support Group, Support Group (Patient declines detailed information about groups)  Post-discharge interventions: Reviewed Newborn Safe Sleep Practices  Malachy Mood  11/15/2023 1613

## 2024-01-17 IMAGING — DX DG FINGER THUMB 2+V*R*
3 series · 3 of 3 positions shown · non-contrast
Comparison: None.

CLINICAL DATA: Trauma and wound of right thumb.

EXAM:
RIGHT THUMB 2+V

[finger ap]
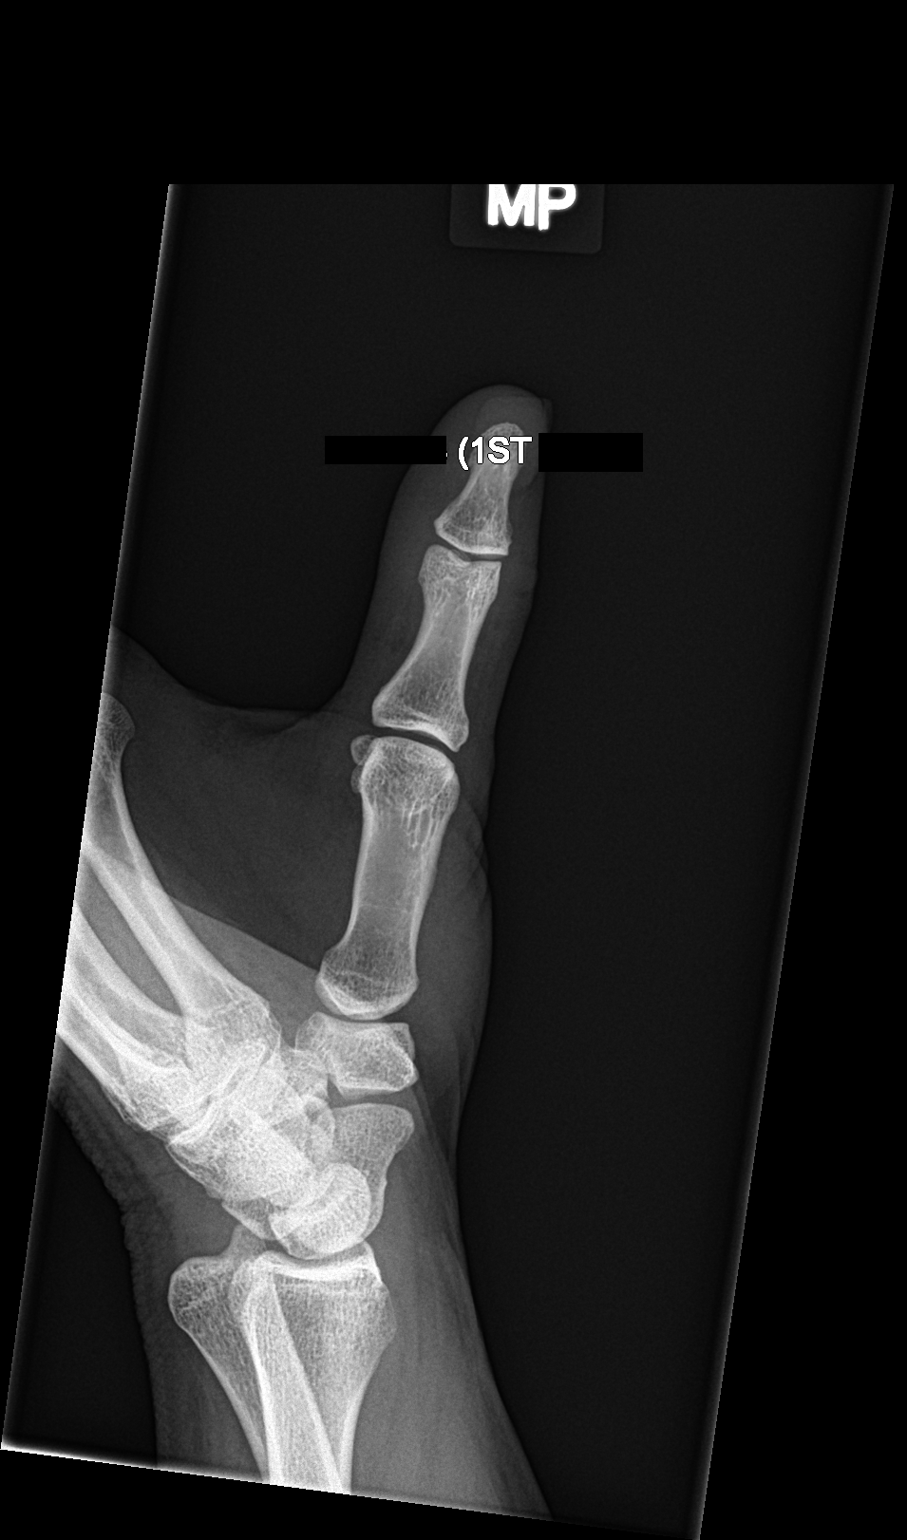

[finger obl]
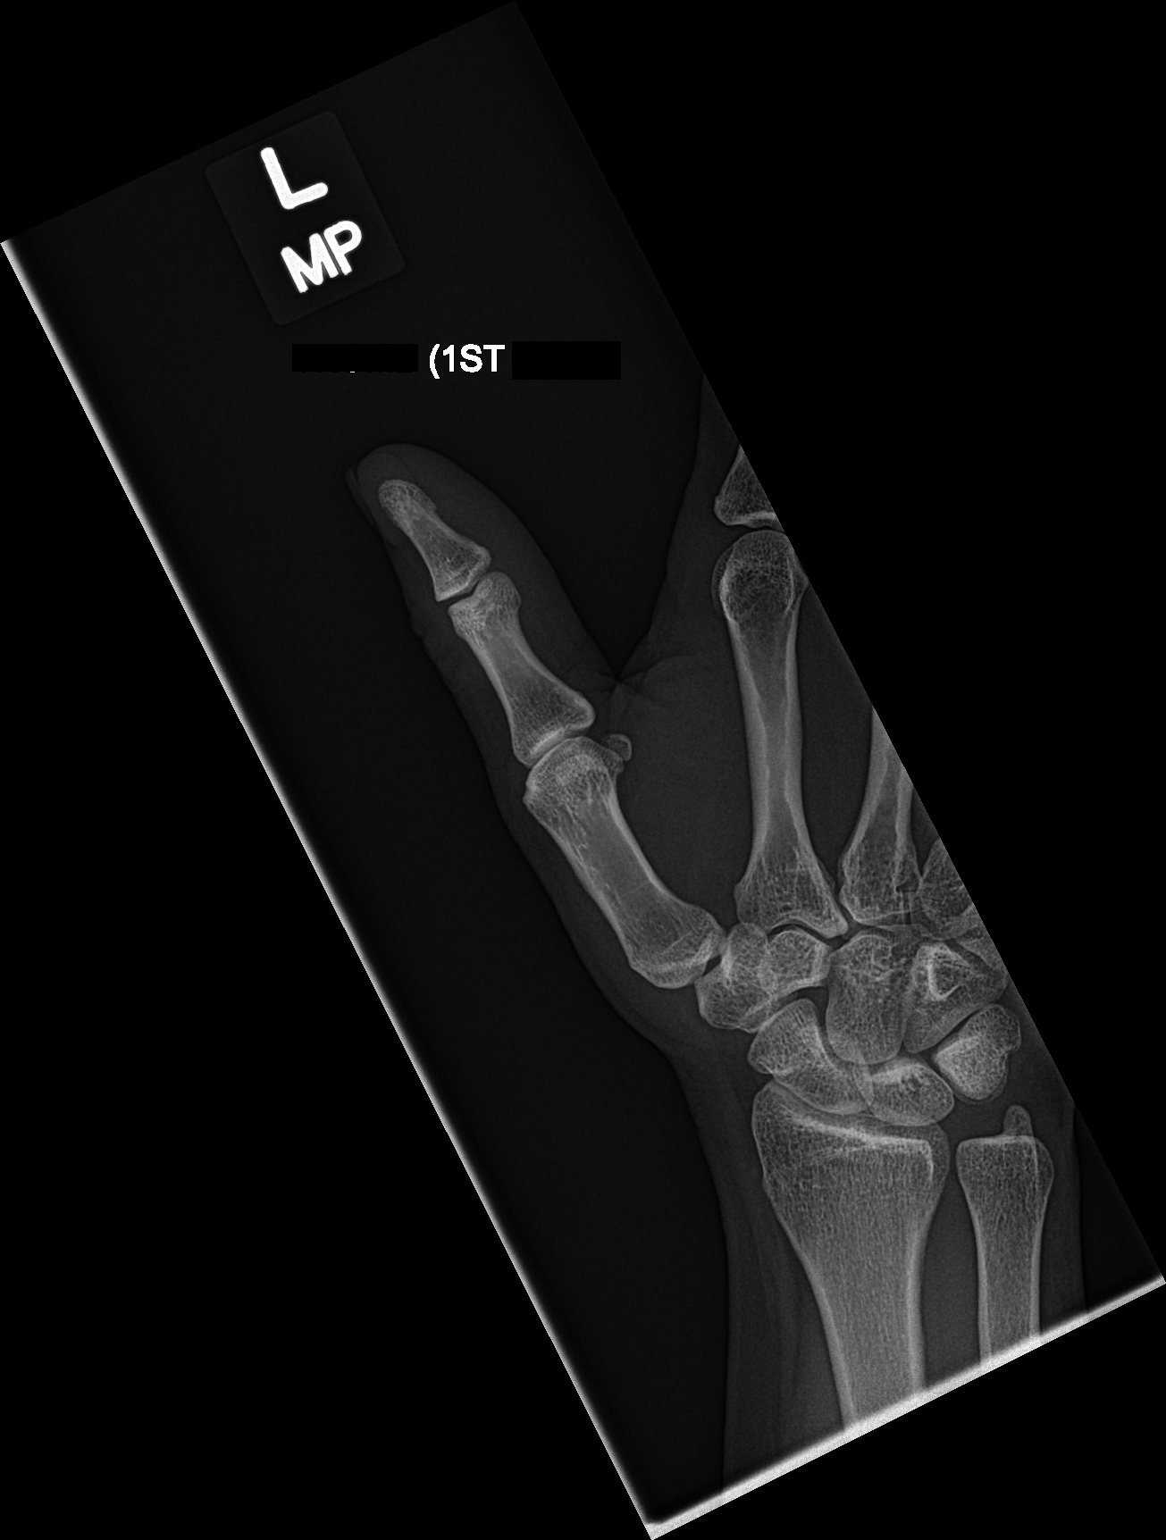

[finger lat]
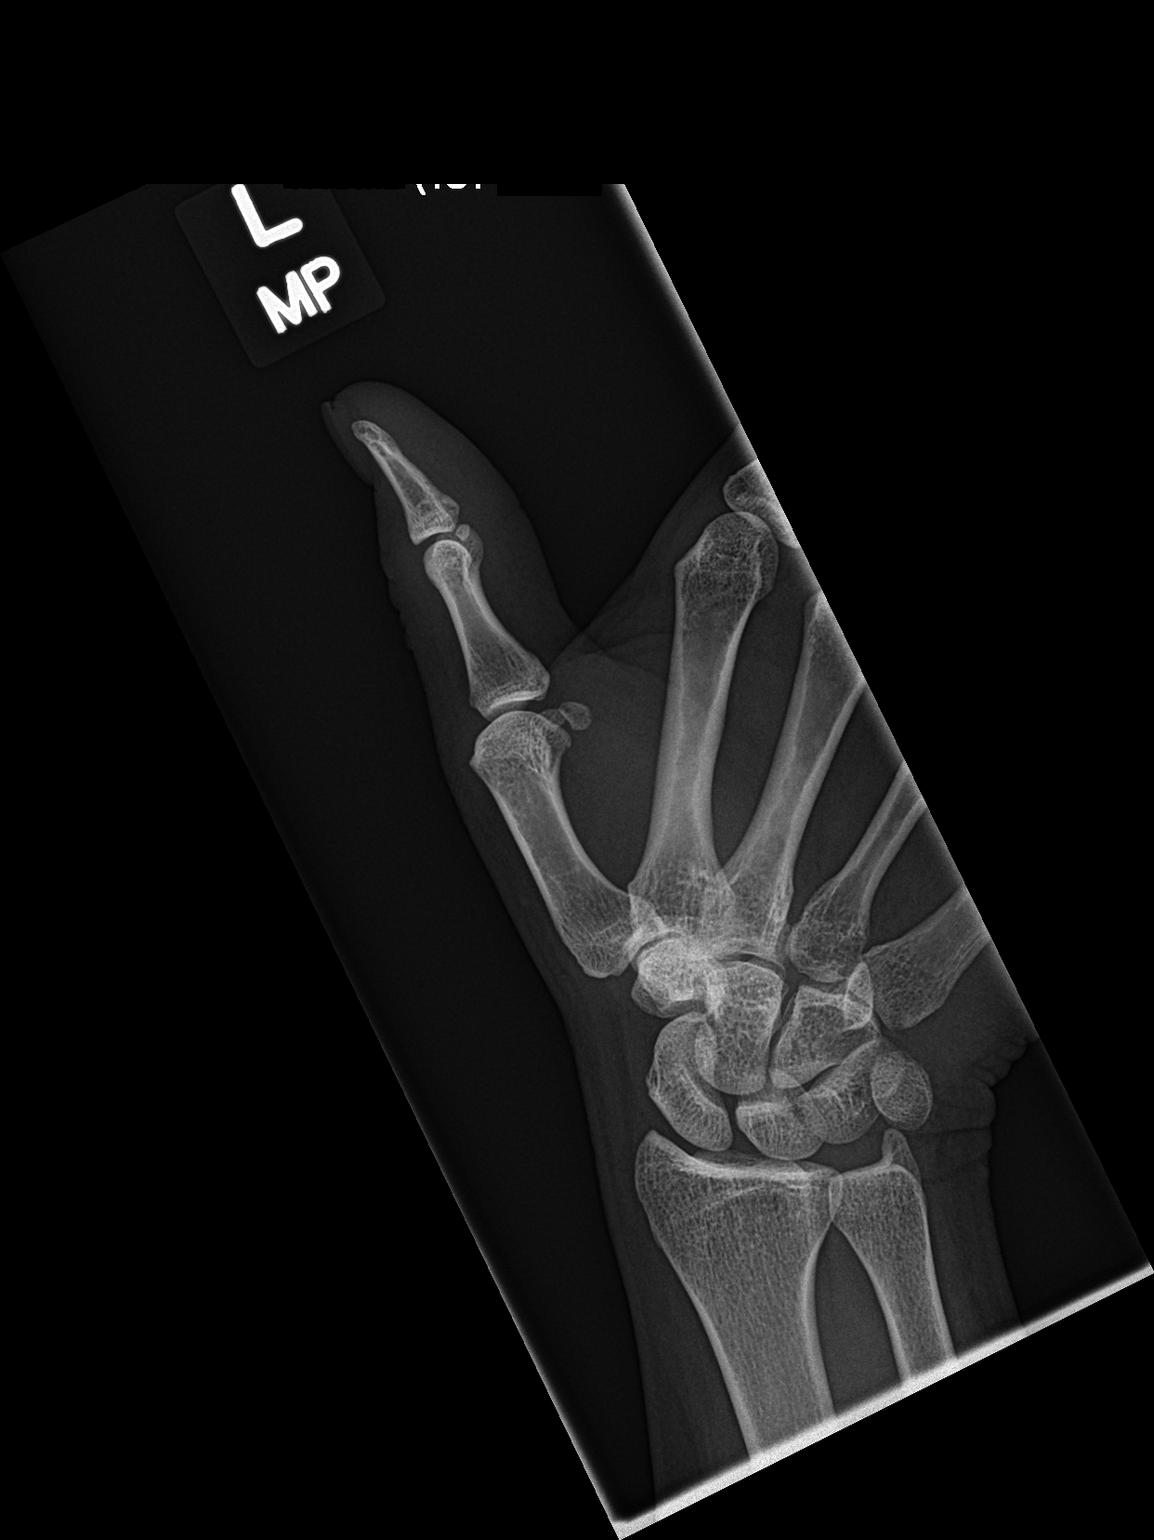

[3 of 3 positions shown; findings below may reference images not displayed]

FINDINGS: Normal bone mineralization. Joint spaces are preserved. No acute
fracture is seen. No dislocation. No radiopaque foreign body.
IMPRESSION: Normal right thumb radiographs.

## 2024-08-04 ENCOUNTER — Ambulatory Visit (INDEPENDENT_AMBULATORY_CARE_PROVIDER_SITE_OTHER): Admitting: Nurse Practitioner

## 2024-08-04 ENCOUNTER — Encounter: Payer: Self-pay | Admitting: Nurse Practitioner

## 2024-08-04 VITALS — BP 138/81 | HR 73 | Temp 97.2°F | Ht 65.0 in | Wt 179.0 lb

## 2024-08-04 DIAGNOSIS — E7800 Pure hypercholesterolemia, unspecified: Secondary | ICD-10-CM

## 2024-08-04 NOTE — Progress Notes (Signed)
" ° °  Subjective:    Patient ID: Kayla Stone, female    DOB: 03-11-1991, 33 y.o.   MRN: [id number]   Chief Complaint: discuss labs  HPI  Patient saw weight loss clinic through work and they started her on wegovy. They drew labs and LDL was elevated. They told her to see her PCP. Lab results not in chart- patient states LDL were 112.  Patient Active Problem List   Diagnosis Date Noted   Pregnancy 11/01/2023   History of cesarean delivery 11/01/2023   Former smoker 06/29/2017   GAD (generalized anxiety disorder) 11/27/2014       Review of Systems  Constitutional:  Negative for diaphoresis.  Eyes:  Negative for pain.  Respiratory:  Negative for shortness of breath.   Cardiovascular:  Negative for chest pain, palpitations and leg swelling.  Gastrointestinal:  Negative for abdominal pain.  Endocrine: Negative for polydipsia.  Skin:  Negative for rash.  Neurological:  Negative for dizziness, weakness and headaches.  Hematological:  Does not bruise/bleed easily.  All other systems reviewed and are negative.      Objective:   Physical Exam Constitutional:      Appearance: Normal appearance. She is obese.  Cardiovascular:     Rate and Rhythm: Normal rate and regular rhythm.     Heart sounds: Normal heart sounds.  Pulmonary:     Breath sounds: Normal breath sounds.  Skin:    General: Skin is warm.  Neurological:     General: No focal deficit present.     Mental Status: She is alert and oriented to person, place, and time.  Psychiatric:        Mood and Affect: Mood normal.        Behavior: Behavior normal.    BP 138/81   Pulse 73   Temp (!) 97.2 F (36.2 C) (Temporal)   Ht 5' 5 (1.651 m)   Wt 179 lb (81.2 kg)   SpO2 98%   BMI 29.79 kg/m         Assessment & Plan:   Kayla Stone in today with chief complaint of Discuss LDL (Had it checked at the weight loss clinic)   1. Elevated LDL cholesterol level (Primary) Low fat diet Ok to continue wegovy RTO  prn    The above assessment and management plan was discussed with the patient. The patient verbalized understanding of and has agreed to the management plan. Patient is aware to call the clinic if symptoms persist or worsen. Patient is aware when to return to the clinic for a follow-up visit. Patient educated on when it is appropriate to go to the emergency department.   Mary-Margaret Gladis, FNP    "

## 2024-08-04 NOTE — Patient Instructions (Signed)

## 2024-09-14 ENCOUNTER — Ambulatory Visit: Payer: Self-pay

## 2024-09-14 NOTE — Telephone Encounter (Signed)
 FYI Only or Action Required?: Action required by provider: requesting work note.  Patient was last seen in primary care on 08/04/2024 by Kayla Mustard, FNP.  Called Nurse Triage reporting Influenza.  Symptoms began 3 days ago.  Interventions attempted: OTC medications: sudafed, motrin .  Symptoms are: gradually worsening.  Triage Disposition: Home Care  Patient/caregiver understands and will follow disposition?: Yes, will follow disposition  Reason for Triage: PT Postive for Flu B /Symptoms: Severe cough congetstion/Cold swseats/fever 101 since 02/02 but today worse    Reason for Disposition  [1] Probable influenza (fever) with no complications AND [2] NOT HIGH RISK  Answer Assessment - Initial Assessment Questions 1. SYMPTOMS: What is your main symptom or concern? (e.g., cough, fever, shortness of breath, muscle aches)     I just want to feel better 2. ONSET: When did the symptoms start?      About 3 days ago 3. COUGH: Do you have a cough? If Yes, ask: How bad is the cough?       Moderate today 4. FEVER: Do you have a fever? If Yes, ask: What is your temperature, how was it measured, and when did it start?     101 this morning, has not checked since OTC meds 5. BREATHING DIFFICULTY: Are you having any difficulty breathing? (e.g., normal; shortness of breath, wheezing, unable to speak)      denies 6. BETTER-SAME-WORSE: Are you getting better, staying the same or getting worse compared to yesterday?  If getting worse, ask, In what way?     Worse since yesterday 7. OTHER SYMPTOMS: Do you have any other symptoms?  (e.g., chills, fatigue, headache, loss of smell or taste, muscle pain, sore throat)     Chills,  9. INFLUENZA SUSPECTED: Why do you think you have influenza? (e.g., positive flu self-test at home, symptoms after exposure).     States tested positive for flu B 10. INFLUENZA VACCINE: Have you had the flu vaccine? If Yes, ask: When did you  last get it?       denies 11. HIGH RISK FOR COMPLICATIONS: Do you have any chronic medical problems? (e.g., asthma, heart or lung disease, obesity, weak immune system)       denies 12. PREGNANCY: Is there any chance you are pregnant? When was your last menstrual period?       Denies  Pt requesting work note.  Protocols used: Influenza (Flu) Suspected-A-AH

## 2024-09-15 ENCOUNTER — Ambulatory Visit: Admitting: Nurse Practitioner

## 2024-09-15 ENCOUNTER — Encounter: Payer: Self-pay | Admitting: Nurse Practitioner

## 2024-09-15 VITALS — BP 135/85 | HR 101 | Temp 97.9°F | Ht 65.0 in | Wt 171.0 lb

## 2024-09-15 DIAGNOSIS — J111 Influenza due to unidentified influenza virus with other respiratory manifestations: Secondary | ICD-10-CM

## 2024-09-15 MED ORDER — OSELTAMIVIR PHOSPHATE 75 MG PO CAPS: 75.0000 mg | ORAL_CAPSULE | Freq: Two times a day (BID) | ORAL | 0 refills | Status: AC

## 2024-09-15 NOTE — Patient Instructions (Signed)

## 2024-09-15 NOTE — Progress Notes (Signed)
 "  Subjective:    Patient ID: Kayla Stone, female    DOB: [birth year], 34 y.o.   MRN: [id number]   Chief Complaint: flu positive at home  Patient in for influenza- she tested positive at home  Influenza This is a new problem. The current episode started in the past 7 days. The problem has been gradually worsening. Associated symptoms include chills, coughing, fatigue and myalgias. Nothing aggravates the symptoms. She has tried nothing for the symptoms. The treatment provided mild relief.     Patient Active Problem List   Diagnosis Date Noted   Pregnancy 11/01/2023   History of cesarean delivery 11/01/2023   Former smoker 06/29/2017   GAD (generalized anxiety disorder) 11/27/2014       Review of Systems  Constitutional:  Positive for chills and fatigue.  Respiratory:  Positive for cough.   Musculoskeletal:  Positive for myalgias.       Objective:   Physical Exam Constitutional:      Appearance: Normal appearance.  HENT:     Right Ear: Tympanic membrane normal.     Left Ear: Tympanic membrane normal.     Nose: Congestion and rhinorrhea present.     Mouth/Throat:     Pharynx: No oropharyngeal exudate or posterior oropharyngeal erythema.  Cardiovascular:     Rate and Rhythm: Normal rate and regular rhythm.     Heart sounds: Normal heart sounds.  Pulmonary:     Effort: Pulmonary effort is normal.     Breath sounds: Normal breath sounds.  Musculoskeletal:     Cervical back: Normal range of motion and neck supple.  Skin:    General: Skin is warm.  Neurological:     General: No focal deficit present.     Mental Status: She is alert and oriented to person, place, and time.  Psychiatric:        Mood and Affect: Mood normal.        Behavior: Behavior normal.    BP 135/85   Pulse (!) 101   Temp 97.9 F (36.6 C)   Ht 5' 5 (1.651 m)   Wt 171 lb (77.6 kg)   SpO2 98%   BMI 28.46 kg/m         Assessment & Plan:   Kayla Stone in today with chief  complaint of Flu like symptoms (Positive Flu B test yesterday. Sx's started 3d ago)   1. Influenza (Primary) 1. Take meds as prescribed 2. Use a cool mist humidifier especially during the winter months and when heat has been humid. 3. Use saline nose sprays frequently 4. Saline irrigations of the nose can be very helpful if done frequently.  * 4X daily for 1 week*  * Use of a nettie pot can be helpful with this. Follow directions with this* 5. Drink plenty of fluids 6. Keep thermostat turn down low 7.For any cough or congestion- mucinex 8. For fever or aces or pains- take tylenol  or ibuprofen  appropriate for age and weight.  * for fevers greater than 101 orally you may alternate ibuprofen  and tylenol  every  3 hours.    Meds ordered this encounter  Medications   oseltamivir  (TAMIFLU ) 75 MG capsule    Sig: Take 1 capsule (75 mg total) by mouth 2 (two) times daily.    Dispense:  10 capsule    Refill:  0    Supervising Provider:   MARYANNE CHEW A [[id number]]      The above assessment and management plan was  discussed with the patient. The patient verbalized understanding of and has agreed to the management plan. Patient is aware to call the clinic if symptoms persist or worsen. Patient is aware when to return to the clinic for a follow-up visit. Patient educated on when it is appropriate to go to the emergency department.   Mary-Margaret Gladis, FNP   "
